# Patient Record
Sex: Male | Born: 1950 | Race: White | Hispanic: No | Marital: Married | Smoking: Never smoker
Health system: Southern US, Community
[De-identification: ages and names within clinical notes are randomized; demographics above are authoritative.]

## PROBLEM LIST (undated history)

## (undated) DIAGNOSIS — K219 Gastro-esophageal reflux disease without esophagitis: Secondary | ICD-10-CM

## (undated) DIAGNOSIS — M1990 Unspecified osteoarthritis, unspecified site: Secondary | ICD-10-CM

## (undated) DIAGNOSIS — Z9289 Personal history of other medical treatment: Secondary | ICD-10-CM

## (undated) DIAGNOSIS — K754 Autoimmune hepatitis: Secondary | ICD-10-CM

## (undated) DIAGNOSIS — K9041 Non-celiac gluten sensitivity: Secondary | ICD-10-CM

## (undated) DIAGNOSIS — J189 Pneumonia, unspecified organism: Secondary | ICD-10-CM

## (undated) DIAGNOSIS — E785 Hyperlipidemia, unspecified: Secondary | ICD-10-CM

## (undated) HISTORY — DX: Autoimmune hepatitis: K75.4

## (undated) HISTORY — PX: CHOLECYSTECTOMY: SHX55

## (undated) HISTORY — DX: Personal history of other medical treatment: Z92.89

## (undated) HISTORY — DX: Hyperlipidemia, unspecified: E78.5

## (undated) HISTORY — PX: SPINE SURGERY: SHX786

## (undated) HISTORY — DX: Non-celiac gluten sensitivity: K90.41

## (undated) HISTORY — PX: BACK SURGERY: SHX140

---

## 1998-11-29 ENCOUNTER — Encounter: Payer: Self-pay | Admitting: Emergency Medicine

## 1998-11-29 ENCOUNTER — Emergency Department (HOSPITAL_COMMUNITY): Admission: EM | Admit: 1998-11-29 | Discharge: 1998-11-29 | Payer: Self-pay | Admitting: Emergency Medicine

## 1999-10-20 ENCOUNTER — Emergency Department (HOSPITAL_COMMUNITY): Admission: EM | Admit: 1999-10-20 | Discharge: 1999-10-20 | Payer: Self-pay | Admitting: Emergency Medicine

## 1999-10-27 ENCOUNTER — Ambulatory Visit (HOSPITAL_COMMUNITY): Admission: RE | Admit: 1999-10-27 | Discharge: 1999-10-27 | Payer: Self-pay | Admitting: Neurosurgery

## 1999-10-27 ENCOUNTER — Encounter: Payer: Self-pay | Admitting: Neurosurgery

## 1999-11-06 ENCOUNTER — Encounter: Payer: Self-pay | Admitting: Neurosurgery

## 1999-11-06 ENCOUNTER — Ambulatory Visit (HOSPITAL_COMMUNITY): Admission: RE | Admit: 1999-11-06 | Discharge: 1999-11-06 | Payer: Self-pay | Admitting: Neurosurgery

## 1999-11-07 ENCOUNTER — Encounter: Payer: Self-pay | Admitting: Neurological Surgery

## 1999-11-07 ENCOUNTER — Inpatient Hospital Stay (HOSPITAL_COMMUNITY): Admission: AD | Admit: 1999-11-07 | Discharge: 1999-11-09 | Payer: Self-pay | Admitting: Neurological Surgery

## 2000-09-12 ENCOUNTER — Encounter: Payer: Self-pay | Admitting: Neurosurgery

## 2000-09-12 ENCOUNTER — Ambulatory Visit (HOSPITAL_COMMUNITY): Admission: RE | Admit: 2000-09-12 | Discharge: 2000-09-12 | Payer: Self-pay | Admitting: Neurosurgery

## 2001-09-16 ENCOUNTER — Encounter (INDEPENDENT_AMBULATORY_CARE_PROVIDER_SITE_OTHER): Payer: Self-pay | Admitting: *Deleted

## 2001-09-16 ENCOUNTER — Ambulatory Visit (HOSPITAL_COMMUNITY): Admission: RE | Admit: 2001-09-16 | Discharge: 2001-09-16 | Payer: Self-pay | Admitting: Family Medicine

## 2001-09-22 ENCOUNTER — Inpatient Hospital Stay (HOSPITAL_COMMUNITY): Admission: AD | Admit: 2001-09-22 | Discharge: 2001-09-30 | Payer: Self-pay | Admitting: Family Medicine

## 2001-09-24 ENCOUNTER — Encounter: Payer: Self-pay | Admitting: Family Medicine

## 2001-10-07 ENCOUNTER — Encounter: Admission: RE | Admit: 2001-10-07 | Discharge: 2001-10-07 | Payer: Self-pay | Admitting: Family Medicine

## 2001-10-10 ENCOUNTER — Encounter: Admission: RE | Admit: 2001-10-10 | Discharge: 2001-10-10 | Payer: Self-pay | Admitting: Family Medicine

## 2001-11-18 ENCOUNTER — Encounter: Admission: RE | Admit: 2001-11-18 | Discharge: 2001-11-18 | Payer: Self-pay | Admitting: Infectious Diseases

## 2001-11-21 ENCOUNTER — Encounter: Admission: RE | Admit: 2001-11-21 | Discharge: 2001-11-21 | Payer: Self-pay | Admitting: Family Medicine

## 2001-12-23 ENCOUNTER — Encounter: Admission: RE | Admit: 2001-12-23 | Discharge: 2001-12-23 | Payer: Self-pay | Admitting: Infectious Diseases

## 2002-02-03 ENCOUNTER — Encounter: Admission: RE | Admit: 2002-02-03 | Discharge: 2002-02-03 | Payer: Self-pay | Admitting: Infectious Diseases

## 2004-07-10 ENCOUNTER — Encounter: Admission: RE | Admit: 2004-07-10 | Discharge: 2004-07-10 | Payer: Self-pay | Admitting: Family Medicine

## 2005-03-16 ENCOUNTER — Encounter: Admission: RE | Admit: 2005-03-16 | Discharge: 2005-03-16 | Payer: Self-pay | Admitting: Family Medicine

## 2005-12-15 ENCOUNTER — Emergency Department (HOSPITAL_COMMUNITY): Admission: EM | Admit: 2005-12-15 | Discharge: 2005-12-15 | Payer: Self-pay | Admitting: Emergency Medicine

## 2005-12-21 ENCOUNTER — Ambulatory Visit (HOSPITAL_COMMUNITY): Admission: RE | Admit: 2005-12-21 | Discharge: 2005-12-21 | Payer: Self-pay | Admitting: Interventional Cardiology

## 2006-01-19 ENCOUNTER — Encounter: Admission: RE | Admit: 2006-01-19 | Discharge: 2006-01-19 | Payer: Self-pay | Admitting: Neurosurgery

## 2006-01-24 ENCOUNTER — Ambulatory Visit (HOSPITAL_COMMUNITY): Admission: RE | Admit: 2006-01-24 | Discharge: 2006-01-24 | Payer: Self-pay | Admitting: Neurosurgery

## 2006-01-26 ENCOUNTER — Encounter: Payer: Self-pay | Admitting: Neurosurgery

## 2006-01-26 ENCOUNTER — Inpatient Hospital Stay (HOSPITAL_COMMUNITY): Admission: AD | Admit: 2006-01-26 | Discharge: 2006-01-29 | Payer: Self-pay | Admitting: Neurosurgery

## 2006-05-03 DIAGNOSIS — K589 Irritable bowel syndrome without diarrhea: Secondary | ICD-10-CM | POA: Insufficient documentation

## 2006-05-03 DIAGNOSIS — E7800 Pure hypercholesterolemia, unspecified: Secondary | ICD-10-CM | POA: Insufficient documentation

## 2006-08-30 ENCOUNTER — Encounter: Admission: RE | Admit: 2006-08-30 | Discharge: 2006-08-30 | Payer: Self-pay | Admitting: Family Medicine

## 2007-02-26 ENCOUNTER — Encounter (INDEPENDENT_AMBULATORY_CARE_PROVIDER_SITE_OTHER): Payer: Self-pay | Admitting: Interventional Radiology

## 2007-02-26 ENCOUNTER — Ambulatory Visit (HOSPITAL_COMMUNITY): Admission: RE | Admit: 2007-02-26 | Discharge: 2007-02-26 | Payer: Self-pay | Admitting: Gastroenterology

## 2007-06-02 ENCOUNTER — Encounter: Admission: RE | Admit: 2007-06-02 | Discharge: 2007-06-02 | Payer: Self-pay | Admitting: Neurosurgery

## 2007-06-19 ENCOUNTER — Ambulatory Visit (HOSPITAL_COMMUNITY): Admission: RE | Admit: 2007-06-19 | Discharge: 2007-06-19 | Payer: Self-pay | Admitting: Neurosurgery

## 2007-06-26 ENCOUNTER — Encounter: Admission: RE | Admit: 2007-06-26 | Discharge: 2007-06-26 | Payer: Self-pay | Admitting: Neurosurgery

## 2007-08-23 ENCOUNTER — Inpatient Hospital Stay (HOSPITAL_COMMUNITY): Admission: RE | Admit: 2007-08-23 | Discharge: 2007-08-26 | Payer: Self-pay | Admitting: Neurosurgery

## 2009-04-22 ENCOUNTER — Ambulatory Visit: Admission: RE | Admit: 2009-04-22 | Discharge: 2009-04-22 | Payer: Self-pay | Admitting: Family Medicine

## 2009-04-22 ENCOUNTER — Encounter: Payer: Self-pay | Admitting: Family Medicine

## 2009-04-22 ENCOUNTER — Ambulatory Visit: Payer: Self-pay | Admitting: Vascular Surgery

## 2009-04-23 ENCOUNTER — Inpatient Hospital Stay (HOSPITAL_COMMUNITY): Admission: EM | Admit: 2009-04-23 | Discharge: 2009-04-24 | Payer: Self-pay | Admitting: Emergency Medicine

## 2009-04-23 ENCOUNTER — Encounter (INDEPENDENT_AMBULATORY_CARE_PROVIDER_SITE_OTHER): Payer: Self-pay | Admitting: General Surgery

## 2009-05-01 ENCOUNTER — Inpatient Hospital Stay (HOSPITAL_COMMUNITY): Admission: EM | Admit: 2009-05-01 | Discharge: 2009-05-07 | Payer: Self-pay | Admitting: Emergency Medicine

## 2010-01-03 ENCOUNTER — Encounter: Admission: RE | Admit: 2010-01-03 | Discharge: 2010-01-03 | Payer: Self-pay | Admitting: Neurosurgery

## 2010-01-13 ENCOUNTER — Encounter: Admission: RE | Admit: 2010-01-13 | Discharge: 2010-01-13 | Payer: Self-pay | Admitting: Neurosurgery

## 2010-01-26 ENCOUNTER — Encounter: Admission: RE | Admit: 2010-01-26 | Discharge: 2010-01-26 | Payer: Self-pay | Admitting: Neurosurgery

## 2010-05-26 LAB — CBC
HCT: 30.5 % — ABNORMAL LOW (ref 39.0–52.0)
HCT: 38.2 % — ABNORMAL LOW (ref 39.0–52.0)
HCT: 38.5 % — ABNORMAL LOW (ref 39.0–52.0)
HCT: 43.7 % (ref 39.0–52.0)
Hemoglobin: 10.9 g/dL — ABNORMAL LOW (ref 13.0–17.0)
Hemoglobin: 13.4 g/dL (ref 13.0–17.0)
Hemoglobin: 13.4 g/dL (ref 13.0–17.0)
Hemoglobin: 15.1 g/dL (ref 13.0–17.0)
MCHC: 34.7 g/dL (ref 30.0–36.0)
MCHC: 34.7 g/dL (ref 30.0–36.0)
MCHC: 35 g/dL (ref 30.0–36.0)
MCHC: 35.6 g/dL (ref 30.0–36.0)
MCV: 101.2 fL — ABNORMAL HIGH (ref 78.0–100.0)
MCV: 101.2 fL — ABNORMAL HIGH (ref 78.0–100.0)
MCV: 101.7 fL — ABNORMAL HIGH (ref 78.0–100.0)
MCV: 102.3 fL — ABNORMAL HIGH (ref 78.0–100.0)
Platelets: 111 10*3/uL — ABNORMAL LOW (ref 150–400)
Platelets: 150 10*3/uL (ref 150–400)
Platelets: 174 10*3/uL (ref 150–400)
Platelets: 99 10*3/uL — ABNORMAL LOW (ref 150–400)
RBC: 3.01 MIL/uL — ABNORMAL LOW (ref 4.22–5.81)
RBC: 3.73 MIL/uL — ABNORMAL LOW (ref 4.22–5.81)
RBC: 3.8 MIL/uL — ABNORMAL LOW (ref 4.22–5.81)
RBC: 4.3 MIL/uL (ref 4.22–5.81)
RDW: 16.6 % — ABNORMAL HIGH (ref 11.5–15.5)
RDW: 16.6 % — ABNORMAL HIGH (ref 11.5–15.5)
RDW: 16.7 % — ABNORMAL HIGH (ref 11.5–15.5)
RDW: 17 % — ABNORMAL HIGH (ref 11.5–15.5)
WBC: 4.1 10*3/uL (ref 4.0–10.5)
WBC: 4.6 10*3/uL (ref 4.0–10.5)
WBC: 7.9 10*3/uL (ref 4.0–10.5)
WBC: 9.4 10*3/uL (ref 4.0–10.5)

## 2010-05-26 LAB — COMPREHENSIVE METABOLIC PANEL
ALT: 25 U/L (ref 0–53)
ALT: 31 U/L (ref 0–53)
ALT: 40 U/L (ref 0–53)
ALT: 52 U/L (ref 0–53)
ALT: 53 U/L (ref 0–53)
AST: 106 U/L — ABNORMAL HIGH (ref 0–37)
AST: 39 U/L — ABNORMAL HIGH (ref 0–37)
AST: 48 U/L — ABNORMAL HIGH (ref 0–37)
AST: 52 U/L — ABNORMAL HIGH (ref 0–37)
AST: 84 U/L — ABNORMAL HIGH (ref 0–37)
Albumin: 2.8 g/dL — ABNORMAL LOW (ref 3.5–5.2)
Albumin: 3 g/dL — ABNORMAL LOW (ref 3.5–5.2)
Albumin: 3.2 g/dL — ABNORMAL LOW (ref 3.5–5.2)
Albumin: 3.4 g/dL — ABNORMAL LOW (ref 3.5–5.2)
Albumin: 3.9 g/dL (ref 3.5–5.2)
Alkaline Phosphatase: 41 U/L (ref 39–117)
Alkaline Phosphatase: 50 U/L (ref 39–117)
Alkaline Phosphatase: 71 U/L (ref 39–117)
Alkaline Phosphatase: 75 U/L (ref 39–117)
Alkaline Phosphatase: 97 U/L (ref 39–117)
BUN: 10 mg/dL (ref 6–23)
BUN: 6 mg/dL (ref 6–23)
BUN: 7 mg/dL (ref 6–23)
BUN: 8 mg/dL (ref 6–23)
BUN: 8 mg/dL (ref 6–23)
CO2: 26 mEq/L (ref 19–32)
CO2: 26 mEq/L (ref 19–32)
CO2: 26 mEq/L (ref 19–32)
CO2: 27 mEq/L (ref 19–32)
CO2: 28 mEq/L (ref 19–32)
Calcium: 7.9 mg/dL — ABNORMAL LOW (ref 8.4–10.5)
Calcium: 8.1 mg/dL — ABNORMAL LOW (ref 8.4–10.5)
Calcium: 8.5 mg/dL (ref 8.4–10.5)
Calcium: 8.6 mg/dL (ref 8.4–10.5)
Calcium: 9.3 mg/dL (ref 8.4–10.5)
Chloride: 104 mEq/L (ref 96–112)
Chloride: 106 mEq/L (ref 96–112)
Chloride: 107 mEq/L (ref 96–112)
Chloride: 108 mEq/L (ref 96–112)
Chloride: 108 mEq/L (ref 96–112)
Creatinine, Ser: 0.83 mg/dL (ref 0.4–1.5)
Creatinine, Ser: 0.84 mg/dL (ref 0.4–1.5)
Creatinine, Ser: 0.86 mg/dL (ref 0.4–1.5)
Creatinine, Ser: 0.87 mg/dL (ref 0.4–1.5)
Creatinine, Ser: 0.92 mg/dL (ref 0.4–1.5)
GFR calc Af Amer: >60 mL/min (ref >60)
GFR calc Af Amer: >60 mL/min (ref >60)
GFR calc Af Amer: >60 mL/min (ref >60)
GFR calc Af Amer: >60 mL/min (ref >60)
GFR calc Af Amer: >60 mL/min (ref >60)
GFR calc non Af Amer: >60 mL/min (ref >60)
GFR calc non Af Amer: >60 mL/min (ref >60)
GFR calc non Af Amer: >60 mL/min (ref >60)
GFR calc non Af Amer: >60 mL/min (ref >60)
GFR calc non Af Amer: >60 mL/min (ref >60)
Glucose, Bld: 103 mg/dL — ABNORMAL HIGH (ref 70–99)
Glucose, Bld: 106 mg/dL — ABNORMAL HIGH (ref 70–99)
Glucose, Bld: 108 mg/dL — ABNORMAL HIGH (ref 70–99)
Glucose, Bld: 117 mg/dL — ABNORMAL HIGH (ref 70–99)
Glucose, Bld: 85 mg/dL (ref 70–99)
Potassium: 3.8 mEq/L (ref 3.5–5.1)
Potassium: 3.9 mEq/L (ref 3.5–5.1)
Potassium: 4.1 mEq/L (ref 3.5–5.1)
Potassium: 4.1 mEq/L (ref 3.5–5.1)
Potassium: 4.4 mEq/L (ref 3.5–5.1)
Sodium: 135 mEq/L (ref 135–145)
Sodium: 137 mEq/L (ref 135–145)
Sodium: 138 mEq/L (ref 135–145)
Sodium: 138 mEq/L (ref 135–145)
Sodium: 138 mEq/L (ref 135–145)
Total Bilirubin: 1.4 mg/dL — ABNORMAL HIGH (ref 0.3–1.2)
Total Bilirubin: 1.4 mg/dL — ABNORMAL HIGH (ref 0.3–1.2)
Total Bilirubin: 2.1 mg/dL — ABNORMAL HIGH (ref 0.3–1.2)
Total Bilirubin: 3.9 mg/dL — ABNORMAL HIGH (ref 0.3–1.2)
Total Bilirubin: 5 mg/dL — ABNORMAL HIGH (ref 0.3–1.2)
Total Protein: 6.6 g/dL (ref 6.0–8.3)
Total Protein: 6.7 g/dL (ref 6.0–8.3)
Total Protein: 7.2 g/dL (ref 6.0–8.3)
Total Protein: 7.5 g/dL (ref 6.0–8.3)
Total Protein: 8.5 g/dL — ABNORMAL HIGH (ref 6.0–8.3)

## 2010-05-26 LAB — HEPATIC FUNCTION PANEL
ALT: 26 U/L (ref 0–53)
AST: 44 U/L — ABNORMAL HIGH (ref 0–37)
Albumin: 3.5 g/dL (ref 3.5–5.2)
Alkaline Phosphatase: 61 U/L (ref 39–117)
Bilirubin, Direct: 0.2 mg/dL (ref 0.0–0.3)
Indirect Bilirubin: 0.9 mg/dL (ref 0.3–0.9)
Total Bilirubin: 1.1 mg/dL (ref 0.3–1.2)
Total Protein: 8.1 g/dL (ref 6.0–8.3)

## 2010-05-26 LAB — DIFFERENTIAL
Basophils Absolute: 0 10*3/uL (ref 0.0–0.1)
Basophils Absolute: 0 10*3/uL (ref 0.0–0.1)
Basophils Relative: 0 % (ref 0–1)
Basophils Relative: 0 % (ref 0–1)
Eosinophils Absolute: 0 10*3/uL (ref 0.0–0.7)
Eosinophils Absolute: 0.1 10*3/uL (ref 0.0–0.7)
Eosinophils Relative: 0 % (ref 0–5)
Eosinophils Relative: 1 % (ref 0–5)
Lymphocytes Relative: 14 % (ref 12–46)
Lymphocytes Relative: 5 % — ABNORMAL LOW (ref 12–46)
Lymphs Abs: 0.4 10*3/uL — ABNORMAL LOW (ref 0.7–4.0)
Lymphs Abs: 0.6 10*3/uL — ABNORMAL LOW (ref 0.7–4.0)
Monocytes Absolute: 0.3 10*3/uL (ref 0.1–1.0)
Monocytes Absolute: 0.3 10*3/uL (ref 0.1–1.0)
Monocytes Relative: 3 % (ref 3–12)
Monocytes Relative: 8 % (ref 3–12)
Neutro Abs: 3.2 10*3/uL (ref 1.7–7.7)
Neutro Abs: 8.6 10*3/uL — ABNORMAL HIGH (ref 1.7–7.7)
Neutrophils Relative %: 77 % (ref 43–77)
Neutrophils Relative %: 92 % — ABNORMAL HIGH (ref 43–77)

## 2010-05-26 LAB — APTT: aPTT: 34 seconds (ref 24–37)

## 2010-05-26 LAB — PROTIME-INR
INR: 1.28 (ref 0.00–1.49)
Prothrombin Time: 15.9 seconds — ABNORMAL HIGH (ref 11.6–15.2)

## 2010-05-26 LAB — LIPASE, BLOOD
Lipase: 111 U/L — ABNORMAL HIGH (ref 11–59)
Lipase: 1201 U/L — ABNORMAL HIGH (ref 11–59)
Lipase: 31 U/L (ref 11–59)
Lipase: >2000 U/L — ABNORMAL HIGH (ref 11–59)

## 2010-05-30 LAB — COMPREHENSIVE METABOLIC PANEL
ALT: 26 U/L (ref 0–53)
ALT: 28 U/L (ref 0–53)
ALT: 29 U/L (ref 0–53)
ALT: 33 U/L (ref 0–53)
AST: 31 U/L (ref 0–37)
AST: 33 U/L (ref 0–37)
AST: 35 U/L (ref 0–37)
AST: 38 U/L — ABNORMAL HIGH (ref 0–37)
Albumin: 2.8 g/dL — ABNORMAL LOW (ref 3.5–5.2)
Albumin: 2.9 g/dL — ABNORMAL LOW (ref 3.5–5.2)
Albumin: 2.9 g/dL — ABNORMAL LOW (ref 3.5–5.2)
Albumin: 3.1 g/dL — ABNORMAL LOW (ref 3.5–5.2)
Alkaline Phosphatase: 60 U/L (ref 39–117)
Alkaline Phosphatase: 64 U/L (ref 39–117)
Alkaline Phosphatase: 70 U/L (ref 39–117)
Alkaline Phosphatase: 71 U/L (ref 39–117)
BUN: 3 mg/dL — ABNORMAL LOW (ref 6–23)
BUN: 3 mg/dL — ABNORMAL LOW (ref 6–23)
BUN: 4 mg/dL — ABNORMAL LOW (ref 6–23)
BUN: 7 mg/dL (ref 6–23)
CO2: 26 mEq/L (ref 19–32)
CO2: 27 mEq/L (ref 19–32)
CO2: 28 mEq/L (ref 19–32)
CO2: 29 mEq/L (ref 19–32)
Calcium: 7.8 mg/dL — ABNORMAL LOW (ref 8.4–10.5)
Calcium: 8.1 mg/dL — ABNORMAL LOW (ref 8.4–10.5)
Calcium: 8.2 mg/dL — ABNORMAL LOW (ref 8.4–10.5)
Calcium: 8.4 mg/dL (ref 8.4–10.5)
Chloride: 103 mEq/L (ref 96–112)
Chloride: 105 mEq/L (ref 96–112)
Chloride: 107 mEq/L (ref 96–112)
Chloride: 107 mEq/L (ref 96–112)
Creatinine, Ser: 0.85 mg/dL (ref 0.4–1.5)
Creatinine, Ser: 0.85 mg/dL (ref 0.4–1.5)
Creatinine, Ser: 0.91 mg/dL (ref 0.4–1.5)
Creatinine, Ser: 0.95 mg/dL (ref 0.4–1.5)
GFR calc Af Amer: >60 mL/min (ref >60)
GFR calc Af Amer: >60 mL/min (ref >60)
GFR calc Af Amer: >60 mL/min (ref >60)
GFR calc Af Amer: >60 mL/min (ref >60)
GFR calc non Af Amer: >60 mL/min (ref >60)
GFR calc non Af Amer: >60 mL/min (ref >60)
GFR calc non Af Amer: >60 mL/min (ref >60)
GFR calc non Af Amer: >60 mL/min (ref >60)
Glucose, Bld: 80 mg/dL (ref 70–99)
Glucose, Bld: 92 mg/dL (ref 70–99)
Glucose, Bld: 98 mg/dL (ref 70–99)
Glucose, Bld: 99 mg/dL (ref 70–99)
Potassium: 3.4 mEq/L — ABNORMAL LOW (ref 3.5–5.1)
Potassium: 3.5 mEq/L (ref 3.5–5.1)
Potassium: 3.7 mEq/L (ref 3.5–5.1)
Potassium: 3.8 mEq/L (ref 3.5–5.1)
Sodium: 133 mEq/L — ABNORMAL LOW (ref 135–145)
Sodium: 137 mEq/L (ref 135–145)
Sodium: 137 mEq/L (ref 135–145)
Sodium: 137 mEq/L (ref 135–145)
Total Bilirubin: 1.4 mg/dL — ABNORMAL HIGH (ref 0.3–1.2)
Total Bilirubin: 1.5 mg/dL — ABNORMAL HIGH (ref 0.3–1.2)
Total Bilirubin: 2.1 mg/dL — ABNORMAL HIGH (ref 0.3–1.2)
Total Bilirubin: 2.6 mg/dL — ABNORMAL HIGH (ref 0.3–1.2)
Total Protein: 6.6 g/dL (ref 6.0–8.3)
Total Protein: 6.7 g/dL (ref 6.0–8.3)
Total Protein: 6.9 g/dL (ref 6.0–8.3)
Total Protein: 7.6 g/dL (ref 6.0–8.3)

## 2010-05-30 LAB — LIPASE, BLOOD
Lipase: 33 U/L (ref 11–59)
Lipase: 34 U/L (ref 11–59)
Lipase: 41 U/L (ref 11–59)

## 2010-05-30 LAB — CBC
HCT: 34.9 % — ABNORMAL LOW (ref 39.0–52.0)
HCT: 35 % — ABNORMAL LOW (ref 39.0–52.0)
HCT: 36.2 % — ABNORMAL LOW (ref 39.0–52.0)
Hemoglobin: 12.3 g/dL — ABNORMAL LOW (ref 13.0–17.0)
Hemoglobin: 12.3 g/dL — ABNORMAL LOW (ref 13.0–17.0)
Hemoglobin: 12.6 g/dL — ABNORMAL LOW (ref 13.0–17.0)
MCHC: 34.9 g/dL (ref 30.0–36.0)
MCHC: 35 g/dL (ref 30.0–36.0)
MCHC: 35.3 g/dL (ref 30.0–36.0)
MCV: 101.8 fL — ABNORMAL HIGH (ref 78.0–100.0)
MCV: 102.1 fL — ABNORMAL HIGH (ref 78.0–100.0)
MCV: 102.6 fL — ABNORMAL HIGH (ref 78.0–100.0)
Platelets: 108 10*3/uL — ABNORMAL LOW (ref 150–400)
Platelets: 127 10*3/uL — ABNORMAL LOW (ref 150–400)
Platelets: 172 10*3/uL (ref 150–400)
RBC: 3.42 MIL/uL — ABNORMAL LOW (ref 4.22–5.81)
RBC: 3.43 MIL/uL — ABNORMAL LOW (ref 4.22–5.81)
RBC: 3.54 MIL/uL — ABNORMAL LOW (ref 4.22–5.81)
RDW: 16 % — ABNORMAL HIGH (ref 11.5–15.5)
RDW: 16.3 % — ABNORMAL HIGH (ref 11.5–15.5)
RDW: 16.6 % — ABNORMAL HIGH (ref 11.5–15.5)
WBC: 4 10*3/uL (ref 4.0–10.5)
WBC: 4.2 10*3/uL (ref 4.0–10.5)
WBC: 5.7 10*3/uL (ref 4.0–10.5)

## 2010-05-30 LAB — DIFFERENTIAL
Basophils Absolute: 0 10*3/uL (ref 0.0–0.1)
Basophils Absolute: 0 10*3/uL (ref 0.0–0.1)
Basophils Relative: 0 % (ref 0–1)
Basophils Relative: 0 % (ref 0–1)
Eosinophils Absolute: 0.1 10*3/uL (ref 0.0–0.7)
Eosinophils Absolute: 0.1 10*3/uL (ref 0.0–0.7)
Eosinophils Relative: 2 % (ref 0–5)
Eosinophils Relative: 3 % (ref 0–5)
Lymphocytes Relative: 15 % (ref 12–46)
Lymphocytes Relative: 23 % (ref 12–46)
Lymphs Abs: 0.6 10*3/uL — ABNORMAL LOW (ref 0.7–4.0)
Lymphs Abs: 0.9 10*3/uL (ref 0.7–4.0)
Monocytes Absolute: 0.4 10*3/uL (ref 0.1–1.0)
Monocytes Absolute: 0.4 10*3/uL (ref 0.1–1.0)
Monocytes Relative: 10 % (ref 3–12)
Monocytes Relative: 10 % (ref 3–12)
Neutro Abs: 2.5 10*3/uL (ref 1.7–7.7)
Neutro Abs: 3.1 10*3/uL (ref 1.7–7.7)
Neutrophils Relative %: 63 % (ref 43–77)
Neutrophils Relative %: 72 % (ref 43–77)

## 2010-05-30 LAB — CLOSTRIDIUM DIFFICILE EIA: C difficile Toxins A+B, EIA: NEGATIVE

## 2010-05-30 LAB — LIPID PANEL
Cholesterol: 95 mg/dL (ref 0–200)
HDL: 25 mg/dL — ABNORMAL LOW (ref >39)
LDL Cholesterol: 61 mg/dL (ref 0–99)
Total CHOL/HDL Ratio: 3.8 RATIO
Triglycerides: 43 mg/dL (ref <150)
VLDL: 9 mg/dL (ref 0–40)

## 2010-05-30 LAB — EYE CULTURE: Culture: NO GROWTH

## 2010-05-30 LAB — LACTATE DEHYDROGENASE: LDH: 106 U/L (ref 94–250)

## 2010-07-04 ENCOUNTER — Other Ambulatory Visit: Payer: Self-pay | Admitting: Gastroenterology

## 2010-07-19 NOTE — Op Note (Signed)
NAME:  Corey Shannon, Corey Shannon NO.:  [id number]   MEDICAL RECORD NO.:  [id number]          PATIENT TYPE:  INP   LOCATION:  3013                         FACILITY:  [facility]   PHYSICIAN:  Leeroy Cha, M.D.   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   PROCEDURE:  Widening of the laminectomy at T12, L1, L2, and L3 with  posterolateral arthrodesis with bone morphogenetic protein and autograft  and master graft from T10 down to L3, Cell Saver.   SURGEON:  Leeroy Cha, MD   CLINICAL HISTORY:  Corey Shannon did well in the past, underwent twice  lumbar laminectomy.  The patient did well last time.  He developed some  difficulty with his bladder, bowel, and some problem with sacral  function.  We did a laminectomy in the upper lumbar area, thoracic area,  and he did well.  Lately, he developed liver immune disease, and he  started developing the same problem.  We did an MRI, it showed some  kyphosis, which with flexion the patient did not move.  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  The patient is moderately better.  We  took over exploration with a possibility of fusing him from T10 down to  L3.  The surgery was fully explained to him including the possibility of  no improvement, whatsoever.   PROCEDURE IN DETAIL:  Corey Shannon was taken to the OR and he was  positioned in a prone manner.  The skin was cleaned with DuraPrep.  We  knew the lower part of the thoracic area was the spinous process of the  T11, so from thereon, we started 1-3 inches above from T10 all the way  down to the area of L3-L4 and L4-L5, fascia was retracted laterally.  The patient had quite a bit of overgrowth of the facet and although the  canal was opened, the disk was quite narrow.  With the drill, we started  drilling the lateral aspect of the lamina of thoracic L1, L2, L3, and on  the upper part of L4.  Then, we went all the way up until we were able  to visualize the spinous process of T10 and T11 as well as the lateral  facets.  In the past, we used a BMP and autograft in the center and we  found out that after we had good decompression, the area was absolutely  solid.  The canal was wide open and we were able to preserve the facet.  The foramen was also opened with a foraminotomy.  Because of that, I  have my doubts in putting the fusion and pedicle screw will improve  overall making it better.  Nevertheless, yet to be sure, 100%  sure, I  brought the stern to the room to take a look.  In deed, he fully agreed  with me that the pedicle screw would not improve the area because of the  solid.  Because of that, we went up again to the area of T10 and T11.  We drilled the lamina at the lateral aspect of the facet and we went  laterally into the upper part of L3 and a mix of BMP and autograft plus  master graft was used.  Nevertheless, prior to that, we read that there  was no contraindication to use the BMP in this case.  Having good  decompression and re-doing of the arthrodesis, the dura mater was closed  with Gelfoam, and the wound was closed with Vicryl and Steri-Strips.           ______________________________  Leeroy Cha, M.D.     EB/MEDQ  D:  08/23/2007  T:  08/24/2007  Job:  [id number]   cc:   Mayme Genta, M.D.

## 2010-07-19 NOTE — H&P (Signed)
NAME:  Corey Shannon, Corey Shannon NO.:  [id number]   MEDICAL RECORD NO.:  [id number]           PATIENT TYPE:   LOCATION:                                 FACILITY:   PHYSICIAN:  Leeroy Cha, M.D.   DATE OF BIRTH:  26-Oct-1950   DATE OF ADMISSION:  DATE OF DISCHARGE:                              HISTORY & PHYSICAL   Corey Shannon is a gentleman who underwent in the past decompressive  laminectomy because of cauda equina syndrome.  At that time, the patient  complained of difficulty with erection and poor control of bladder and  bowel.  He had surgery in 2007.  He did really well.  Nevertheless,  lately he had been complaining of the same problem with difficulty with  his bladder and bowel, numbness in the genitalia, and back pain.  He  denies any shooting pain down to the lower extremities.  We did an MRI  of the lumbar spine which showed that he has multiple level disk  degeneration with laminectomy from T12 through L4 with kyphosis at the  level of T11-T12.  The patient is being admitted for surgery.  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  His  liver enzymes went really out of control.  He was seen by Dr. Thana Farr and  we got information a few days ago that he was ready for surgery.  He was  seen also by anesthesia who agreed with the surgery.   PAST MEDICAL HISTORY:  Lumbar decompression in the past from T12-L1,  also L1-L2, L2-L3, L3-L4, and L4-L5.  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   MEDICATIONS:  He is at the present time on prednisone and Lipitor.   FAMILY HISTORY:  Unremarkable.   REVIEW OF SYSTEMS:  Positive for back pain and difficulty with bladder  and bowel.   PHYSICAL EXAMINATION:  HEAD, EARS, NOSE, AND THROAT:  Normal.  NECK:  Normal.  LUNGS:  Clear.  HEART:  Heart sounds normal.  ABDOMEN:  Normal.  EXTREMITIES: There were normal strength and normal pulse.  NEUROLOGIC:  Straight leg raising is positive  bilaterally to 60 degrees.  RECTAL:  He has a decreased rectal tone.  GENITALIA:  He has some numbness which involves mostly the genitalia.   The lumbar-thoracic MRI showed that he has multiple-level disk  degeneration all the way down to the cervical area.  The lumbar MRI  showed that he has laminectomy of T12 T1 through L4.  Patency of the  canal and clubbing of the nerve mostly at the levels of L1, L2, and L3  with kyphosis at the level of T12-L1.   RECOMMENDATIONS:  The patient is being admitted for surgery.  As I  mentioned to him on several occasions, the only procedure will be to  decompress again from T10 to L1 and L2 using thoracic and lumbar pedicle  screws with posterolateral arthrodesis using BMP.   He knows that that is the only thing we can do to help him with his  conus medullaris syndrome, and it will not  affect all degenerative disk  diseases he has above and below.  The patient knows all of the risks  such as paralysis, worsening of his bladder and bowel, no improvement  whatsoever in his sexual activity, and further surgery for degenerative  disk disease in multiple layers.           ______________________________  Leeroy Cha, M.D.     EB/MEDQ  D:  08/23/2007  T:  08/23/2007  Job:  [id number]

## 2010-07-22 NOTE — H&P (Signed)
[place]. [facility]  Patient:    Corey Shannon, Corey Shannon                       MRN: [id number] Adm. Date:  [id number] Attending:  Clearnce Sorrel                         History and Physical  NO DICTATION. DD:  11/08/99 TD:  11/08/99 Job: [id number]

## 2010-07-22 NOTE — Discharge Summary (Signed)
. [facility]  Patient:    Corey Shannon, Corey Shannon                       MRN: [id number] Adm. Date:  [id number] Disc. Date: [id number] Attending:  Clearnce Sorrel CC:         Domingo Pulse, M.D., urology   Discharge Summary  ADMISSION DIAGNOSIS:  Lumbar stenosis with conus medullaris syndrome.  DISCHARGE DIAGNOSIS:  Lumbar stenosis with conus medullaris syndrome.  CLINICAL HISTORY:  The patient was admitted as an emergency because of difficulty urinating with cramps in both legs.  In the past, this gentleman had an L1 and 2 diskectomy.  MRI showed stenosis at the level of T12-L1. Surgery was advanced emergently.  LABORATORY:  Normal.  HOSPITAL COURSE:  The patient was taken to surgery on Labor Day, November 07, 1999, and bilateral T12 laminectomy with decompression of spinal cord was done. Today he is better. His bladder is working and he is ambulating.  He has minimal weakness.  He is being discharged to be followed by ______________ office.  CONDITION ON DISCHARGE:  Improved.  DISCHARGE MEDICATIONS:  Percocet and Diazepam.  DIET:  Regular.  ACTIVITIES:  He is not to do any lifting.  FOLLOW-UP:  He will be seen in the office in 10 days. DD:  11/09/99 TD:  11/09/99 Job: [id number]

## 2010-07-22 NOTE — Discharge Summary (Signed)
NAME:  Corey Shannon, Corey Shannon                ACCOUNT NO.:  [id number]   MEDICAL RECORD NO.:  [id number]          PATIENT TYPE:  OIB   LOCATION:  3172                         FACILITY:  [facility]   PHYSICIAN:  Leeroy Cha, M.D.   DATE OF BIRTH:  [birth year]   DATE OF ADMISSION:  08/26/2005  DATE OF DISCHARGE:  08/29/2005                                 DISCHARGE SUMMARY   ADMISSION DIAGNOSIS:  Lumbar stenosis L2-L3, L3-L4 with cauda equina  syndrome.   POSTOPERATIVE DIAGNOSIS:  Lumbar stenosis L2-L3, L3-L4 with cauda equina  syndrome.   CLINICAL HISTORY:  The patient was admitted because of difficulty urinating  and having a bowel movement.  He also had some back pain.  X-rays show  stenosis at the level of L2-L3, L3-L4.  Previously he has had laminectomy of  L4.  Surgery was advised as urgent the day after Thanksgiving.   LABORATORY:  Normal.   COURSE IN THE HOSPITAL:  The patient was taken to surgery and had a  decompressive laminectomy of L2-L3, L3-L4 with a posterolateral fusion using  autograft and BMP from T12 down to L4 was made.  After surgery the patient  is able to urinate on his own, but still has not had a bowel movement.  He  felt that he was ready to go home.   CONDITION ON DISCHARGE:  Improvement in relation to his bladder.   DIET:  Regular.   ACTIVITY:  Not to drive for at least a week.   MEDICATIONS:  He will be taking Urecholine, Flomax, Percocet, and lorazepam.   FOLLOWUP:  He will be seeing [place] in 3 or 4 weeks.  We are going to set up an  appointment with Dr. Amalia Hailey, the urologist, for him to help [place] with his  bladder.           ______________________________  Leeroy Cha, M.D.     EB/MEDQ  D:  01/29/2006  T:  01/29/2006  Job:  [id number]

## 2010-07-22 NOTE — Op Note (Signed)
[place]. [facility]  Patient:    Corey Shannon, Corey Shannon                       MRN: [id number] Proc. Date: 11/07/99 Adm. Date:  [id number] Attending:  Clearnce Sorrel                           Operative Report  PREOPERATIVE DIAGNOSIS:  Thoracolumbar stenosis, T12-L1 with conus medullaris syndrome.  POSTOPERATIVE DIAGNOSIS:  Thoracolumbar stenosis, T12-L1 with conus medullaris syndrome.  OPERATION PERFORMED:  Bilateral T12 laminectomy, decompression of the spinal cord.  Midas Rex.  SURGEON:  Zigmund Daniel. Joya Salm, M.D.  ASSISTANT:  Earleen Newport, M.D.  ANESTHESIA:  INDICATIONS FOR PROCEDURE:  The patient is a 60 year old gentleman who about 12 years ago underwent L1-L2 diskectomy and laminectomy because of sudden onset of weakness with difficulty urinating, losing bladder control.  About four weeks ago the patient fell at home, hit his head and he was having a little bit of bleeding and he was seen in the emergency room where a laceration was taken care of.  He called my office and when I saw him in my office I did a CT scan of the head which was unremarkable.  Clinically, he has complained of tingling sensation in both hands and some soreness in both legs. The physical examination was essentially normal.  He called the office Friday afternoon complaining that he was having a little difficulty urinating. Because of the previous history, we went ahead and did an emergency MRI which showed stenosis at the level of T12-L1 with a central disk and the possibility of some changes in the cord itself.  ____________ Because of that he is being brought today for emergency surgery.  The patient knew of the risks such as no improvement whatsoever, difficulty urinating and with the bowels and also the high possibility that in the near future this gentleman would require major surgery which involves fusion.  The procedure was explained to him and  his wife.  DESCRIPTION OF PROCEDURE:  The patient was taken to the operating room and he was positioned in a prone manner.  We knew by previous x-ray from previous surgery that he had present the spinal stenosis of T11 and L3.  After we scrubbed the area, a midline incision was made from lower part of T11 to the upper part of L2.  Muscles were retracted laterally.  We identified the T11 spinous processes.  Nevertheless we went ahead and we did several x-rays.  We found the lamina of T12 and we did bilateral laminectomy using the Midas Rex and using the 2 and 3 mm Kerrison punch.  A thick yellow ligament was also excised.  There was epidural fat which was removed.  The spinal cord was wide open and we explored the area with small probe and there was no evidence of any fragment.  Nevertheless because of looking at the x-ray there was a diffuse bulging disk, we decided not to go into the area of the disk.  Having good decompression, we repeated the x-ray which showed indeed we were in the right area.  We went lower until we found the scar from the previous surgery. Then the dura mater was covered with Gelfoam and the wound was closed with Vicryl and nylon. DD:  11/07/99 TD:  11/08/99 Job: [id number]

## 2010-07-22 NOTE — H&P (Signed)
NAME:  Corey Shannon, Corey Shannon NO.:  [id number]   MEDICAL RECORD NO.:  [id number]          PATIENT TYPE:  INP   LOCATION:  3008                         FACILITY:  [facility]   PHYSICIAN:  Leeroy Cha, M.D.   DATE OF BIRTH:  19-Nov-1950   DATE OF ADMISSION:  01/26/2006  DATE OF DISCHARGE:  01/29/2006                                HISTORY & PHYSICAL   Mr. Jepsen is a gentleman who in 2001 underwent decompressive laminectomy in  the thoracolumbar area because of weakness of bladder and bowel.  Presently  well.  We did thoracic 12 and L1 decompression.  The patient saw me in my  office on October 18th complaining of burning sensation to the right thigh  and he was having some difficulty with bladder and bowel.  Nevertheless, he  was able to control his bladder and bowel.  He went away and I saw him in my  office on November 21st after we did a myelogram.  Indeed he was having more  difficulty urinating and he has markedly decreased rectal tone.  Also  burning sensation while he was standing.  Because of that we agreed to  proceed with a thoracolumbar myelogram just to be sure there was no evidence  of any tumor, although we knew by myelogram that he had some stenosis at the  L1-L2 to L3 and L3-L4.  The MRI was unremarkable and we brought him the day  after Thanksgiving for surgery.   PAST MEDICAL HISTORY:  1. Lumbar decompression at the level of T12-L1.  2. Diskectomy L1-L2.  3. He has a __________ syndrome history and he has a maxilla problem.  4. He also has spasticity of the colon.   MEDICATIONS:  He is taking Lipitor and aspirin.   FAMILY HISTORY:  Unremarkable.   REVIEW OF SYSTEMS:  Back pain, tingling sensation in both legs, difficulty  with bladder and bowel.   PHYSICAL EXAMINATION:  HEENT:  Normal.  NECK:  Normal.  LUNGS:  Clear.  HEART:  Sounds normal.  ABDOMEN:  Normal.  EXTREMITIES:  Normal pulse.  NEURO:  Mental status normal.  Cranial nerves normal.   Strength is normal.  He has had marked decrease of the rectal tone.  Reflexes are symmetrical.  Coordination and gait normal.   CLINICAL IMPRESSION:  Lumbar stenosis L1-L2, L2-L3, L3-L4.  Cauda equina  syndrome.   RECOMMENDATIONS:  The patient is being admitted for surgery.  Prior to  surgery we have agreed that it is difficult to predict what the outcome will  be but unfortunately I have nothing else to offer.  The other possibility  was to do a complete workup including GI and GU investigation but at the  end, we agreed to decompress the lumbar area  to see how well he does and then do the workup after.  The patient wanted to  proceed because he feels that he is getting worse over the past 3 weeks.  The surgery was fully explained to him including the possibility of no  improvement whatsoever, paralysis, infection, CSF leak.  ______________________________  Leeroy Cha, M.D.     EB/MEDQ  D:  01/30/2006  T:  01/30/2006  Job:  [id number]

## 2010-07-22 NOTE — H&P (Signed)
. [facility]  Patient:    Corey Shannon, Corey Shannon                       MRN: [id number] Adm. Date:  [id number] Attending:  Clearnce Sorrel                         History and Physical  CHIEF COMPLAINT: Herniated nucleus pulposus, T12-L1, with conus medullaris syndrome.  HISTORY OF PRESENT ILLNESS: The patient is a 60 year old white male, who has had previous disk disease at L1-L2 15 years ago and underwent laminectomy at that time.  The patient had been doing fairly well and three weeks ago he suffered a fall in the bathtub and had a small frontal contusion.  He was recovering, though he noted some numbness in his proximal leg and this past weekend he developed increasing difficulty with urination.  He was found to have on MRI a herniated nucleus pulposus at T12-L1 with significant cord compression.  The patient was contacted regarding results of the study and as he was continuing to have significant difficulty he was advised to undergo surgical decompression via laminectomy with decompression.  He is now admitted for this procedure.  PAST MEDICAL HISTORY: His general health has been good.  PAST SURGICAL HISTORY:  1. Lumbar laminectomy 15 years ago.  2. Transurethral resection of the prostate three years ago by Dr. Alona Bene.  CURRENT MEDICATIONS:  1. Lipitor 20 mg q.d.  2. Flomax for intermittent episodes of prostatitis.  3. He also had been given a prescription for cephalexin, having had a     root canal recently completed.  ALLERGIES: No known drug allergies.  FAMILY HISTORY: His father has a history of coronary artery disease and there is also a history of coronary artery disease in the grandfather.  SOCIAL HISTORY: He is married.  His work involves significant amounts of travel and he is planning a trip out of the country to [place] the end of this coming week.  REVIEW OF SYSTEMS: Notable for difficulty with urination.  No  respiratory, GI, cardiovascular, ENT, integumentary difficulties noted.  PHYSICAL EXAMINATION:  VITAL SIGNS: Blood pressure 130/86, heart rate 66 and regular, respirations 14.  NEUROLOGIC: He is alert and oriented, and in no overt distress.  He stands and walks without difficulty.  There is weakness noted proximally in the iliopsoas, being graded 4/5.  No atrophy is noted.  Deep tendon reflexes are 1+ in the patella, trace in the Achilles.  Babinskis downgoing.  Upper extremity strength and reflexes normal.  Cranial nerve examination reveals the pupils are 4 mm and briskly reactive to light and accommodation.  Extraocular movements were full.  Face with symmetric grimace.  Tongue and uvula midline. Sclerae and conjunctivae clear.  HEART: Regular rate and rhythm.  No murmurs heard.  No bruits heard.  LUNGS: Clear to auscultation.  ABDOMEN: Soft.  Bowel sounds positive.  No masses palpable.  EXTREMITIES: No clubbing, cyanosis, or edema.  IMPRESSION/PLAN: The patient has a herniated disk at the T12-L1 level and is now being admitted to undergo surgical decompression for developing conus medullaris syndrome.DD:  11/07/99 TD:  11/07/99 Job: [id number]

## 2010-07-22 NOTE — Discharge Summary (Signed)
NAME:  Corey Shannon, Corey Shannon                          ACCOUNT NO.:  [id number]   MEDICAL RECORD NO.:  [id number]                   PATIENT TYPE:  INP   LOCATION:  5730                                 FACILITY:  [facility]   PHYSICIAN:  Jamal Collin. Hensel, M.D.             DATE OF BIRTH:  1950-12-04   DATE OF ADMISSION:  09/22/2001  DATE OF DISCHARGE:  09/30/2001                                 DISCHARGE SUMMARY   DISCHARGE DIAGNOSES:  1. Left toe cellulitis.  2. Nocardia lymphocutaneous infection.  3. Irritable bowel syndrome.  4. Hypercholesterolemia.   DISCHARGE MEDICATIONS:  1. Bactrim Double Strength two tablets p.o. b.i.d.  2. Vicodin 5/325 1-2 tablets p.o. q.4-6h. p.r.n. for pain.  3. Eucerin cream to be applied topically to the infected toe twice per day.   DISCHARGE ACTIVITIES:  No activity restrictions.   DISCHARGE DIET:  No diet restrictions.   WOUND CARE:  Instructions were to keep the skin area  moist and watch for  cracking and blistering, apply Neosporin to open areas and Eucerin cream  twice daily.   FOLLOWUP APPOINTMENTS:  The patient will follow up with Dr. Alford Highland on  September 8 at 11:45 and will follow up with Dr. Kassie Mends on August 7 at  1:30 p.m. at the [facility].   CHIEF COMPLAINT:  The patient was admitted on September 22, 2001, with the  complaint of left toe swelling and cellulitis.   HISTORY OF PRESENT ILLNESS:  This is a 60 year old white male who presented  to urgent care on the day of admission for increasing erythema and swelling  of his left third toe.  The patient was working at his house approximately  two or three weeks ago and dropped a brick on his toe on the left side.  The  patient said that the toe was sore but there was no drainage or erythema  until approximately four days ago.  No major open wounds or lacerations were  noted on the toe after initial injury.  The patient saw his primary care  physician at his workplace  for the increased swelling and erythema on  Friday, July 18.  At that time the patient denied any fevers, chills,  nausea, vomiting or other systemic symptoms.  An incision and drainage was  performed on the toe and the patient was started on p.o. Keflex.  Over the  next following two days the patient started noticing streaking up the dorsum  of the foot and up the medial calf and then to the left groin.  On the night  prior to admission the patient began to run a fever of 100.5 somewhat  relieved with Tylenol.  The patient also began to notice significant  inguinal adenopathy and tenderness which occurred the night before  admission.  The patient was seen in urgent care and admitted to [facility] for IV antibiotics for  suspected left toe cellulitis refractory to  p.o. antibiotics.   PAST MEDICAL HISTORY:  1. The patient has a past medical history significant for chronic     prostatitis for which he takes Flomax and diazepam p.r.n. for     exacerbations.  2. Irritable bowel syndrome for which he sees Dr. Currie Paris and was on a seven-     day course of mesalamine enema treatments upon admission.  The patient     had a colonoscopy with biopsies performed in June 2003.   HOSPITAL COURSE BY DIAGNOSIS:  1. Cellulitis.  Blood cultures were obtained initially on presentation.  The     patient's pain was managed with Percocet and Toradol as needed.  The     patient had an initial white blood cell count of 12.5 upon admission and     was found to be afebrile at that time with a temperature of 98.8.  The     patient had a second incision and drainage performed on the left third     toe with culture and Gram stains obtained at the time of procedure.  We     were concerned about strep and staph cellulitis but were considering     possible Pseudomonas coverage considering the patient was wearing lots of     tennis shoes and this is a good setup for Pseudomonas infection, although     the  patient was immunocompetent.  There was some question about whether     or not the patient truly failed p.o. antibiotics or whether inadequate     amounts of medications were the cause for the failure.  The patient was     started on Ancef initially IV antibiotics.  Over the next 2-3 days the     patient's pain was fairly well controlled although the patient complained     of more inguinal lymphadenopathy and discomfort in the inguinal area.     The patient continued to spike daily fevers in the realm of 101.4 and was     unable to go more than 24 hours without fever.  Secondary to increased     fevers we switched the patient from Ancef to Unasyn for possible     pseudomonal coverage and decided to get an MRI of the foot to look for     suspected osteomyelitis.  The MRI showed no signs of abscess or     osteomyelitis.  The patient was switched from Unasyn to Zosyn after     continuing to spike fevers on Unasyn as well.  Over the course of the by     hospital day #4 or #5 the patient had continued to have increasing     erythema within the demarcated boundaries initially drawn on     presentation.  The patient started developing lymphocutaneous nodules     along what appeared to be the saphenous vein line up the medial portions     of the calf and thigh.  This caused the patient a great deal of     discomfort at which time the patient was switched over from Percocet to     Toradol and morphine p.r.n. for pain.  Infectious disease was consulted     on hospital day #4 for failure to control fevers and questionable     resistance to the current antibiotic regimen.  The patient had a second     incision and drainage of focal pus pockets of the third toe  on hospital     day #5 and continued to spike fevers, even on Zosyn therapy.  The patient     continued to spike fevers with systemic symptoms of fevers, chills,    shakes at times febrile episodes.  On hospital day #6 wound cultures grew     out  Nocardia at which time the patient was discontinued off his Zosyn and     switched to Primaxin 500 mg IV q.8h. and Bactrim Double Strength two p.o.     q.8h.  Infectious disease was reconsulted, Dr. Alford Highland was reconsulted     for treatment of lymphocutaneous Nocardia infection.  The patient was     started on the two double antibiotics and remained afebrile for 48 hours     at which point in time Dr. Alford Highland felt it was safe to discontinue the     Primaxin and sent the patient home on Bactrim Double Strength two p.o.     q.12h. with regular followups secondary to needing a chronic course of     Bactrim therapy.  The patient's pain was well controlled with Vicodin     before the patient was discharged, and the patient was discharged on     Vicodin and Bactrim with followup for Nocardia lymphangitis to follow up     with Dr. Alford Highland and Dr. Kassie Mends.  2. Nocardia lymphocutaneous infection.  See course of events for #1, left     toe cellulitis.  It was believed that there was a concomitant cellulitis     and lymphocutaneous picture.  Once the cellulitis was adequately treated     with IV antibiotics the cellulitis was refractory to current antibiotic     regimen.  It became more clear once cultures were obtained.  Blood     cultures were held in the laboratory and will be rechecked in several     weeks for signs of Nocardia growth, as Nocardia is a slow-growing     organism that may take several weeks to grow out on plates.  We will need     to follow these up after the patient is discharged.  3. Irritable bowel syndrome.  Discussed with Dr. Currie Paris the continuation of     the mesalamine enemas.  The patient originally completed a seven-day     course upon admission and Dr. Currie Paris felt that, given recent biopsies,     the patient should continue on mesalamine enemas while he was an     inpatient for another seven days.  The patient received daily mesalamine     enemas and complained of  no GI complaints including diarrhea,     constipation, or     nausea or GI pain.  The patient was discharged to follow up with Dr.     Currie Paris as previously arranged.  4. Hypercholesterolemia.  The patient was started on his outpatient     medication of 20 mg of Lipitor q.d. and monitored.  No changes in regimen     during hospital course admission.     Leonides Schanz, M.D.                      William A. Andria Frames, M.D.    RM/MEDQ  D:  10/09/2001  T:  10/14/2001  Job:  [id number]

## 2010-07-22 NOTE — Discharge Summary (Signed)
NAME:  Corey Shannon, Corey Shannon                ACCOUNT NO.:  [id number]   MEDICAL RECORD NO.:  [id number]          PATIENT TYPE:  INP   LOCATION:  3013                         FACILITY:  [facility]   PHYSICIAN:  Leeroy Cha, M.D.   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   POSTOPERATIVE DIAGNOSIS:  Degenerative disc disease of thoracolumbar  with cauda equina syndrome.   PROCEDURE:  The patient was admitted to the hospital because of pain in  both the leg, numbness in the genitalia, and difficult with his sexual  function, bladder and bowel.  Previously this gentleman had  decompression of the lumbar area.  X-rays show that he has kyphosis with  stenosis in the thoracolumbar area.  Surgery was advised.  Laboratory  normal.   COURSE IN THE HOSPITAL:  The patient was taken to surgery and  decompression from thoracic 11 to lumbar 3 was done with posterolateral  arthrodesis from T10 to L3 using autograft and BMP.  After surgery, the  patient complained with a normal postoperative pain but he felt better  to the point that he noticed 48-hour later improved his bladder and  bowel function.  The patient has been ambulating.  He has been  discharged, being followed by me.   CONDITION ON DISCHARGE:  Improvement.   MEDICATIONS:  Percocet, Diazepam.   DIET:  Regular.   ACTIVITIES:  Not to drive for at least 2 weeks.   FOLLOWUP:  He will be seen in my office in 4 weeks.           ______________________________  Leeroy Cha, M.D.     EB/MEDQ  D:  09/17/2007  T:  09/18/2007  Job:  [id number]

## 2010-07-22 NOTE — Op Note (Signed)
NAME:  Corey Shannon, Corey Shannon                ACCOUNT NO.:  [id number]   MEDICAL RECORD NO.:  [id number]          PATIENT TYPE:  INP   LOCATION:  3008                         FACILITY:  [facility]   PHYSICIAN:  Leeroy Cha, M.D.   DATE OF BIRTH:  04/28/1950   DATE OF PROCEDURE:  01/26/2006  DATE OF DISCHARGE:                                 OPERATIVE REPORT   PREOPERATIVE DIAGNOSES:  1. Lumbar stenosis, L2-3, L3-4.  2. Cauda equina syndrome.  3. Status post decompressive laminectomy, T11-L1 seven years ago.   POSTOPERATIVE DIAGNOSES:  1. Lumbar stenosis, L2-3, L3-4.  2. Cauda equina syndrome.  3. Status post decompressive laminectomy, T11-L1 seven years ago.   PROCEDURES:  1. Bilateral 2, 3, 4 laminectomy.  2. Posterolateral arthrodesis from T12 to L4 with bone morphogenic protein      and autograft.   SURGEON:  Leeroy Cha, M.D.   ASSISTANT:  Ophelia Charter, M.D.   CLINICAL HISTORY:  Mr. Corey Shannon is a 60 year old gentleman, who back 7 years  ago underwent decompressive laminectomy at the thoracic level/12 and L1  secondary to weakness of the legs associated with urinary incontinence, as  well as bowel.  The patient did well.  I saw him on 1 occasion with some  back discomfort, but he did really well.  About 6 or 8 weeks ago, he  underwent some type of face surgery, and after that, he noticed some  weakness in his bladder and bowel, as well as tingling sensation in both  legs.  I saw him in my office about 5 weeks ago and he decided to wait to  see if he would get better on his own.  Nevertheless, we agreed on a lumbar  myelogram, thoracolumbar, which shows only stenosis at the levels of 2-3 and  3-4.  I saw him 2 days ago later in my office, and, indeed, this gentleman  had numbness in the scrotum, as well as marked hypotonic rectal tone.  He  had no weakness in the lower extremities.  Because of that, we went ahead  and we did an emergency MRI, thoracolumbar, to rule out the  possibility of a  lower lesion, such as a tumor, or sclerosis.  Nothing was found.  I talked  to him day, Thanksgiving, and I mentioned to him the findings of the MRI.  I  gave him 2 choices.  One will be to do some urodynamic studies of the  bladder to see if his problem is coming from peripheral neuropathy or  something else, or to proceed with surgery.  I told him that the surgery  will be to decompress the L2, L3, L4 area, as well as fusion to avoid  kyphosis.  I told him that that is the only thing I can do, and then I  mentioned to him that I was not 100% sure that the surgery would be helpful  as 7 years ago.   Nevertheless, these were the only options, and then, if after ruling out a  surgical lesion he does not improve, we can continue with the workup, as  we  mentioned above.  The patient fully agreed and surgery was scheduled for  today, the day after Thanksgiving.  The risks included no improvement,  worsening of his clinical condition, infection, CSF leak.   DESCRIPTION OF PROCEDURES:  Mr. Corey Shannon was taken to the operating room and he  was positioned in a prone manner.  The back was prepared with Betadine.  A  midline incision following the previous one was made.  X-rays showed that,  we were at the level of L2 to L4.  Then, we went laterally and retracted all  the way down to the joint between the facet and the transverse process.  Because he already has quite a bit of degenerative disk disease and some  kyphosis from previous surgery, we went ahead and we visualized the facets  of T12 and the transverse processes all the way down to L4.  With the  [place], we removed the spinous processes of L2, L3 and L4, and with the  drill, we drilled the laminae all the way down into the joint and removing  1/3 of the medial facet.  He had some narrowing of the foramina and  foraminotomy was accomplished.  At the end, we had a good decompression from  L2 down to L4.  The foramina were  open.  Then, with the drill, we removed  the periosteum of the lateral aspect of the facets of T12, as well as the  transverse processes all the way down to L4.  Then, a mix of BMP as well as  autograft was used at the site of the fusion.  The area was irrigated.  Hemostasis was done with bipolar.  The wound was closed with Vicryl and with  Steri-Strips.           ______________________________  Leeroy Cha, M.D.     EB/MEDQ  D:  01/26/2006  T:  01/26/2006  Job:  [id number]

## 2010-07-22 NOTE — H&P (Signed)
[place]  Patient:    Corey Shannon, BROKER Visit Number: [id number] MRN: [id number]          Service Type: MED Location: [phone] Attending Physician:  Zigmund Gottron Dictated by:   Christiana Fuchs, M.D. Admit Date:  09/22/2001                           History and Physical  DATE OF BIRTH:  1950-11-16  CHIEF COMPLAINT:  Left toe swelling and infection.  HISTORY OF PRESENT ILLNESS:  The patient is a 60 year old white male who presented to Urgent Care today for increasing erythema and swelling of the left third toe.  The patient was working around the house approximately three weeks ago and dropped a brick on the third toe on the left side.  The patient said the toe was sore, but no drainage or erythema until approximately four days ago.  On Friday, July 18, the patient presented to his work physician for increased swelling and erythema.  At that time, the patient denied any fever, chills, nausea, vomiting, or other systemic symptoms.  Incision and drainage was performed on the toe, and the patient was started on Keflex p.o.  Over the next two days, the patient starting noticing streaking up the dorsum of the foot, up the medial calf, and then up into the left groin.  Last night, the patient began to run a fever of 100.5 which was relieved by Tylenol.  The patient also began to notice inguinal adenopathy and tenderness which occurred overnight.  The patient was seen at Urgent Care today and admitted for IV antibiotics for left toe cellulitis refractory to p.o. antibiotics.  PAST MEDICAL HISTORY: 1. Chronic prostatitis for which he takes Flomax and Diazepam p.r.n. for    acute exacerbation. 2. Irritable bowel syndrome for which he sees Dr. Deatra Ina, and the patient    was given a 7-day course of mesalamine enema treatments.  The patient had    colonoscopy with biopsies performed June 2003. 3. Status post TURP procedure in 1998 by  Dr. Amalia Hailey. 4. Status post total lumbar laminectomy in the past.  CURRENT MEDICATIONS: 1. Lipitor 20 mg q.d. 2. Flomax p.r.n. 3. Diazepam p.r.n. 4. Keflex x 2 days now. 5. Mesalamine enema x 7 days.  ALLERGIES:  No known drug allergies.  SOCIAL HISTORY:  The patient lives here in [place] with his wife and works for [employer] as a Engineer, maintenance.  The patient travels a great deal.  The patient denies smoking, has minimal alcohol intake; the patient claims one or two beers two to three times per week.  No illicit drug use.  FAMILY HISTORY:  Positive family history in father for coronary artery disease, and paternal grandfather is also positive for coronary artery disease.  Uncle positive for colon cancer at the age of approximately 83.  No family history of diabetes.  PHYSICAL EXAMINATION:  VITAL SIGNS:  Temperature 98.8, blood pressure 126/81, pulse 94, respirations 14.  Weight 262 pounds.  He is afebrile.  GENERAL:  The patient is a pleasant male in no acute distress, resting quite comfortable.  NECK:  No cervical lymphadenopathy, no thyromegaly.  HEENT:  Pharynx is moist with no exudative lesions.  CARDIOVASCULAR:  Regular rate and rhythm with no murmurs, rubs, or gallops.  LUNGS:  Clear to auscultation bilaterally with no wheezing.  ABDOMEN:  Soft, mildly obese, nontender, nondistended, positive bowel sounds. There  are no palpable masses or hepatosplenomegaly.  EXTREMITIES:  Left third toe erythema with central healing ulcerated lesion approximately 1 cm in the center with positive streaking in dorsum of the foot and patchy painful erythema of medial calf and thigh.  Moderate left inguinal lymphadenopathy noted.  Streaking is warm to touch and painful on palpation. Erythema outlined in pen and no drainage noted from the third toe.  DIAGNOSTIC DATA:  Pertinent labs obtained at Urgent Care revealed a white blood count of 4.1, hemoglobin 14.8, platelets  270.  Outside x-ray preliminary review reveals no signs of osteomyelitis, moderate soft tissue swelling.  Blood cultures were drawn and are pending.  ASSESSMENT/PLAN:  This is a healthy 60 year old white male with history of traumatic lesion to the left third toe with resultant cellulitis refractory to p.o. antibiotics.  1. Cellulitis.  The patient is refractory to p.o. Keflex with streaking    and spreading of infection to the left inguinal area.  Moderate amount    of pain on ambulation.  Patient is afebrile now without drainage.  Will    get blood cultures x 2 today and treat with IV Ancef 1 g q.6h.  Will    monitor progression of erythema, and will get bone scan if no improvement    is noted in the first 24 to 48 hours.  X-ray currently negative for    osteomyelitis.  The patient will receive Percocet for pain as well as    heating pad for symptoms relief of erythema and pain in leg. 2. Hypercholesterolemia.  The patient will resume home medicine dose of    Lipitor 20 mg q.d.   No further action needed at this time. 3. Irritable bowel syndrome.  The patient will finish day #7 of his    mesalamine treatment tonight.  Will continue to monitor for diarrhea    and other signs.  DISPOSITION:  Will monitor closely given the patients traumatic cause of his cellulitis and will likely discharge patient home if patient shows good response to IV antibiotics for the first 24 to 36 hours. Dictated by:   Christiana Fuchs, M.D. Attending Physician:  Zigmund Gottron DD:  09/22/01 TD:  09/25/01 Job: [id number]

## 2010-12-01 LAB — CBC
HCT: 40.4
Hemoglobin: 13.9
MCHC: 34.4
MCV: 93.6
Platelets: 205
RBC: 4.31
RDW: 17.7 — ABNORMAL HIGH
WBC: 4.5

## 2010-12-01 LAB — BASIC METABOLIC PANEL
BUN: 5 — ABNORMAL LOW
CO2: 27
Calcium: 9.1
Chloride: 105
Creatinine, Ser: 0.82
GFR calc Af Amer: >60
GFR calc non Af Amer: >60
Glucose, Bld: 85
Potassium: 4.4
Sodium: 137

## 2010-12-01 LAB — TYPE AND SCREEN
ABO/RH(D): O POS
ABO/RH(D): O POS
Antibody Screen: POSITIVE
Antibody Screen: POSITIVE
DAT, IgG: NEGATIVE

## 2010-12-01 LAB — HEPATIC FUNCTION PANEL
ALT: 56 — ABNORMAL HIGH
AST: 110 — ABNORMAL HIGH
Albumin: 3.2 — ABNORMAL LOW
Alkaline Phosphatase: 120 — ABNORMAL HIGH
Bilirubin, Direct: 0.4 — ABNORMAL HIGH
Indirect Bilirubin: 1.2 — ABNORMAL HIGH
Total Bilirubin: 1.6 — ABNORMAL HIGH
Total Protein: 8.3

## 2010-12-01 LAB — PROTIME-INR
INR: 1.2
Prothrombin Time: 15.3 — ABNORMAL HIGH

## 2010-12-01 LAB — APTT: aPTT: 36

## 2010-12-09 LAB — PROTEIN ELECTROPH W RFLX QUANT IMMUNOGLOBULINS
Albumin ELP: 27 — ABNORMAL LOW
Alpha-1-Globulin: 2.6 — ABNORMAL LOW
Alpha-2-Globulin: 4.8 — ABNORMAL LOW
Beta 2: 4.8
Beta Globulin: 3.8 — ABNORMAL LOW
Gamma Globulin: 57 — ABNORMAL HIGH
M-Spike, %: NOT DETECTED
Total Protein ELP: 11.2 — ABNORMAL HIGH

## 2010-12-09 LAB — CBC
HCT: 40.7
Hemoglobin: 14.3
MCHC: 35.2
MCV: 90.7
Platelets: 196
RBC: 4.48
RDW: 15.7 — ABNORMAL HIGH
WBC: 4.7

## 2010-12-09 LAB — COMPREHENSIVE METABOLIC PANEL
ALT: 204 — ABNORMAL HIGH
AST: 304 — ABNORMAL HIGH
Albumin: 2.6 — ABNORMAL LOW
Alkaline Phosphatase: 179 — ABNORMAL HIGH
BUN: 3 — ABNORMAL LOW
CO2: 25
Calcium: 8.3 — ABNORMAL LOW
Chloride: 103
Creatinine, Ser: 1
GFR calc Af Amer: >60
GFR calc non Af Amer: >60
Glucose, Bld: 94
Potassium: 3.7
Sodium: 132 — ABNORMAL LOW
Total Bilirubin: 1.7 — ABNORMAL HIGH
Total Protein: 11.3 — ABNORMAL HIGH

## 2010-12-09 LAB — APTT: aPTT: 37

## 2010-12-09 LAB — ANA: Anti Nuclear Antibody(ANA): NEGATIVE

## 2010-12-09 LAB — PROTIME-INR
INR: 1.3
Prothrombin Time: 16.6 — ABNORMAL HIGH

## 2010-12-09 LAB — ANTI-SMITH ANTIBODY: ENA SM Ab Ser-aCnc: 0.2 AI (ref <1.0)

## 2011-04-23 ENCOUNTER — Ambulatory Visit: Payer: BC Managed Care – PPO

## 2011-04-23 ENCOUNTER — Ambulatory Visit (INDEPENDENT_AMBULATORY_CARE_PROVIDER_SITE_OTHER): Payer: BC Managed Care – PPO | Admitting: Family Medicine

## 2011-04-23 DIAGNOSIS — R509 Fever, unspecified: Secondary | ICD-10-CM

## 2011-04-23 DIAGNOSIS — IMO0001 Reserved for inherently not codable concepts without codable children: Secondary | ICD-10-CM

## 2011-04-23 DIAGNOSIS — R059 Cough, unspecified: Secondary | ICD-10-CM

## 2011-04-23 DIAGNOSIS — R6889 Other general symptoms and signs: Secondary | ICD-10-CM

## 2011-04-23 DIAGNOSIS — R05 Cough: Secondary | ICD-10-CM

## 2011-04-23 LAB — POCT CBC
Granulocyte percent: 70.3 %G (ref 37–80)
HCT, POC: 35.2 % — AB (ref 43.5–53.7)
Hemoglobin: 10.5 g/dL — AB (ref 14.1–18.1)
Lymph, poc: 0.7 (ref 0.6–3.4)
MCH, POC: 22.6 pg — AB (ref 27–31.2)
MCHC: 29.8 g/dL — AB (ref 31.8–35.4)
MCV: 75.7 fL — AB (ref 80–97)
MID (cbc): 0.5 (ref 0–0.9)
MPV: 7.9 fL (ref 0–99.8)
POC Granulocyte: 2.7 (ref 2–6.9)
POC LYMPH PERCENT: 17.6 %L (ref 10–50)
POC MID %: 12.1 %M — AB (ref 0–12)
Platelet Count, POC: 115 10*3/uL — AB (ref 142–424)
RBC: 4.65 M/uL — AB (ref 4.69–6.13)
RDW, POC: 21.5 %
WBC: 3.8 10*3/uL — AB (ref 4.6–10.2)

## 2011-04-23 LAB — POCT INFLUENZA A/B
Influenza A, POC: NEGATIVE
Influenza B, POC: NEGATIVE

## 2011-04-23 MED ORDER — LEVOFLOXACIN 500 MG PO TABS: 750.0000 mg | ORAL_TABLET | Freq: Every day | ORAL | Status: AC

## 2011-04-23 MED ORDER — IBUPROFEN 200 MG PO TABS
800.0000 mg | ORAL_TABLET | Freq: Once | ORAL | Status: AC
  Administered 2011-04-23: 800 mg via ORAL

## 2011-04-23 NOTE — Progress Notes (Signed)
[facility]:  Office Visit  Chief Complaint:  Chief Complaint  Patient presents with  . Cough  . Sore Throat  . Fever    HPI: Corey Shannon is a 61 y.o. male who complains of  1 day h/o fever, chills and cough, msk soreness. Was traveling [place] for work. Has had flu injection. Did not take OTC meds to treat. 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 Hyperlipidemia   . 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 Gluten intolerance    Past Surgical History  Procedure Date  . Spine surgery    History   Social History  . Marital Status: Married    Spouse Name: N/A    Number of Children: N/A  . Years of Education: N/A   Social History Main Topics  . Smoking status: Never Smoker   . Smokeless tobacco: None  . Alcohol Use: No  . Drug Use: No  . Sexually Active: None   Other Topics Concern  . None   Social History Narrative  . None   History reviewed. No pertinent family history. Allergies  Allergen Reactions  . Gluten   . Latex   . Sulfa Antibiotics    Prior to Admission medications   Medication Sig Start Date End Date Taking? Authorizing Provider  calcium & magnesium carbonates (MYLANTA) 311-232 MG per tablet Take 1 tablet by mouth daily.   Yes Historical Provider, MD  fish oil-omega-3 fatty acids 1000 MG capsule Take 2 g by mouth daily.   Yes Historical Provider, MD  Pseudoeph-Doxylamine-DM-APAP (NYQUIL) 60-7.08-02-998 MG/30ML LIQD Take by mouth.   Yes Historical Provider, MD  Vitamin D, Ergocalciferol, (DRISDOL) 50000 UNITS CAPS Take 50,000 Units by mouth.   Yes Historical Provider, MD     ROS: The patient denies night sweats, unintentional weight loss, chest pain, palpitations, wheezing, dyspnea on exertion, nausea, vomiting, abdominal pain, dysuria, hematuria, melena, numbness, weakness, or tingling. + fevers, chills, cough  All other systems have been reviewed and  were otherwise negative with the exception of those mentioned in the HPI and as above.    PHYSICAL EXAM: Filed Vitals:   04/23/11 1448  BP: 139/76  Pulse: 90  Temp: 102.9 F (39.4 C)  Resp: 16   Filed Vitals:   04/23/11 1448  Height: 5' 11.5" (1.816 m)  Weight: 261 lb 12.8 oz (118.752 kg)  Spo2   97% Body mass index is 36.00 kg/(m^2).  General: Alert, no acute distress, tired appearing HEENT:  Normocephalic, atraumatic, oropharynx patent. Tm normal.  Cardiovascular:  Regular rate and rhythm, no rubs murmurs or gallops.  No Carotid bruits, radial pulse intact. No pedal edema.  Respiratory: Clear to auscultation bilaterally.  No wheezes, rales, or rhonchi.  No cyanosis, no use of accessory musculature GI: No organomegaly, abdomen is soft and non-tender, positive bowel sounds.  No masses. Skin: No rashes. Neurologic: Facial musculature symmetric. Psychiatric: Patient is appropriate throughout our interaction. Lymphatic: No cervical lymphadenopathy Musculoskeletal: Gait intact.  LABS: Results for orders placed in visit on 04/23/11  POCT INFLUENZA A/B      Component Value Range   Influenza A, POC Negative     Influenza B, POC Negative    POCT CBC      Component Value Range   WBC 3.8 (*) 4.6 - 10.2 (K/uL)   Lymph, poc 0.7  0.6 - 3.4    POC LYMPH  PERCENT 17.6  10 - 50 (%L)   MID (cbc) 0.5  0 - 0.9    POC MID % 12.1 (*) 0 - 12 (%M)   POC Granulocyte 2.7  2 - 6.9    Granulocyte percent 70.3  37 - 80 (%G)   RBC 4.65 (*) 4.69 - 6.13 (M/uL)   Hemoglobin 10.5 (*) 14.1 - 18.1 (g/dL)   HCT, POC 35.2 (*) 43.5 - 53.7 (%)   MCV 75.7 (*) 80 - 97 (fL)   MCH, POC 22.6 (*) 27 - 31.2 (pg)   MCHC 29.8 (*) 31.8 - 35.4 (g/dL)   RDW, POC 21.5     Platelet Count, POC 115 (*) 142 - 424 (K/uL)   MPV 7.9  0 - 99.8 (fL)     EKG/XRAY:   Primary read interpreted by Dr. Marin Comment at [facility]. ? Left lobe infiltrate? Versus  Vascular congestion   ASSESSMENT/PLAN: Encounter Diagnoses  Name Primary?  .  Cough   . Flu-like symptoms   . Fever    Suspicious for PNA. CBC not reflective of infection since was recently on Imuran for autoimmune hepatitis.  Flu was negative.    1. Will treat with Levaquin 750 mg daily x 10 days for questionable infiltrate on xray. If no improvement then will give him Tamiflu within the next 24 hrs since he has been traveling overseas and is immune compromised. Patient given MOtrin in office and advise to take Motrin for fever and OTC meds for cough.       F/u by phone in 24 hrs  Kanai Hilger, [place], DO 04/23/2011 3:52 PM

## 2011-04-24 ENCOUNTER — Telehealth: Payer: Self-pay

## 2011-04-24 ENCOUNTER — Other Ambulatory Visit: Payer: Self-pay | Admitting: Family Medicine

## 2011-04-24 DIAGNOSIS — R6889 Other general symptoms and signs: Secondary | ICD-10-CM

## 2011-04-24 DIAGNOSIS — R05 Cough: Secondary | ICD-10-CM

## 2011-04-24 DIAGNOSIS — R059 Cough, unspecified: Secondary | ICD-10-CM

## 2011-04-24 MED ORDER — OSELTAMIVIR PHOSPHATE 75 MG PO CAPS: 75.0000 mg | ORAL_CAPSULE | Freq: Two times a day (BID) | ORAL | Status: AC

## 2011-04-24 MED ORDER — HYDROCOD POLST-CHLORPHEN POLST 10-8 MG/5ML PO LQCR: 5.0000 mL | Freq: Two times a day (BID) | ORAL | Status: DC | PRN

## 2011-04-24 MED ORDER — BENZONATATE 200 MG PO CAPS: 200.0000 mg | ORAL_CAPSULE | Freq: Two times a day (BID) | ORAL | Status: AC | PRN

## 2011-04-24 NOTE — Telephone Encounter (Signed)
Spoke with patient's wife and told her xray was normal. Will continue Levaquin x 7 days and start him on Tamiflu. I honestly think he has the flu even though the flu test was negative. He is febrile bt not taking TYlenol due to h/o Celiac sprue and autoimmune hepatitis. Told them to do a 1 time dose of 1 gram of tylenol and then 600 mg of motrin q 6 hrs to get fever under control. Will give rx for Tussionex and tessalon perles  to pick up at front desk.

## 2011-04-24 NOTE — Telephone Encounter (Signed)
.  [facility]     PTS WIFE CALLING TO SAY HUSBAND IS NOT DOING [facility] LIKE ADVICE   BEST PHONE [phone]

## 2011-04-24 NOTE — Telephone Encounter (Signed)
Pt wife is calling and would like for Dr Marin Comment to know pt has 103.5 fever please contact she said she left a message yesterday.

## 2011-04-24 NOTE — Telephone Encounter (Signed)
Spoke with wife and she is worried about pt's increase in temp and wants to make sure you don't think he should go to ER. Temp has been up to 103.5, never lower than 101. Fever did respond some to Motrin last night, but took motrin at 12:30 today, and temp spiked after that. Pt has however, only taking one 200 mg motrin d/t hx of autoimmune hepatitis. Pt coughing now productive w/ yellowish Gasparyan sputum. Used cold mist humidifier last night which helped his cough some. Pt requests cough medicine, esp for night, and please advise if fever is too high. At what level should they worry?

## 2011-04-25 ENCOUNTER — Telehealth: Payer: Self-pay | Admitting: Family Medicine

## 2011-04-25 NOTE — Telephone Encounter (Signed)
Attempted to call today to see how he is feeling , if fever still present but phone numbers are wrong.

## 2011-08-24 ENCOUNTER — Other Ambulatory Visit: Payer: Self-pay | Admitting: Dermatology

## 2012-01-15 ENCOUNTER — Other Ambulatory Visit: Payer: Self-pay | Admitting: Dermatology

## 2012-10-09 ENCOUNTER — Other Ambulatory Visit: Payer: Self-pay | Admitting: Gastroenterology

## 2012-11-29 ENCOUNTER — Encounter (HOSPITAL_COMMUNITY)
Admission: RE | Disposition: A | Discharge status: Home / Self Care | Payer: Self-pay | Source: Ambulatory Visit | Attending: Gastroenterology

## 2012-11-29 ENCOUNTER — Ambulatory Visit (HOSPITAL_COMMUNITY)
Admission: RE | Admit: 2012-11-29 | Discharge: 2012-11-29 | Disposition: A | Discharge status: Home / Self Care | Payer: BC Managed Care – PPO | Source: Ambulatory Visit | Attending: Gastroenterology | Admitting: Gastroenterology

## 2012-11-29 ENCOUNTER — Encounter (HOSPITAL_COMMUNITY): Payer: Self-pay | Admitting: *Deleted

## 2012-11-29 DIAGNOSIS — E785 Hyperlipidemia, unspecified: Secondary | ICD-10-CM | POA: Insufficient documentation

## 2012-11-29 DIAGNOSIS — K319 Disease of stomach and duodenum, unspecified: Secondary | ICD-10-CM | POA: Insufficient documentation

## 2012-11-29 DIAGNOSIS — K766 Portal hypertension: Secondary | ICD-10-CM | POA: Insufficient documentation

## 2012-11-29 DIAGNOSIS — K900 Celiac disease: Secondary | ICD-10-CM | POA: Insufficient documentation

## 2012-11-29 DIAGNOSIS — L88 Pyoderma gangrenosum: Secondary | ICD-10-CM | POA: Insufficient documentation

## 2012-11-29 DIAGNOSIS — K754 Autoimmune hepatitis: Secondary | ICD-10-CM | POA: Insufficient documentation

## 2012-11-29 DIAGNOSIS — I8500 Esophageal varices without bleeding: Secondary | ICD-10-CM | POA: Insufficient documentation

## 2012-11-29 HISTORY — PX: ESOPHAGOGASTRODUODENOSCOPY: SHX5428

## 2012-11-29 HISTORY — PX: ESOPHAGEAL BANDING: SHX5518

## 2012-11-29 SURGERY — EGD (ESOPHAGOGASTRODUODENOSCOPY)
Anesthesia: Moderate Sedation

## 2012-11-29 MED ORDER — SODIUM CHLORIDE 0.9 % IV SOLN
INTRAVENOUS | Status: DC
  Administered 2012-11-29: 500 mL via INTRAVENOUS

## 2012-11-29 MED ORDER — FENTANYL CITRATE 0.05 MG/ML IJ SOLN
INTRAMUSCULAR | Status: DC | PRN
  Administered 2012-11-29 (×2): 25 ug via INTRAVENOUS
  Administered 2012-11-29: 10 ug via INTRAVENOUS

## 2012-11-29 MED ORDER — MIDAZOLAM HCL 10 MG/2ML IJ SOLN
INTRAMUSCULAR | Status: AC
  Filled 2012-11-29: qty 4

## 2012-11-29 MED ORDER — MIDAZOLAM HCL 10 MG/2ML IJ SOLN
INTRAMUSCULAR | Status: DC | PRN
  Administered 2012-11-29 (×4): 2 mg via INTRAVENOUS

## 2012-11-29 MED ORDER — FENTANYL CITRATE 0.05 MG/ML IJ SOLN
INTRAMUSCULAR | Status: AC
  Filled 2012-11-29: qty 4

## 2012-11-29 NOTE — Op Note (Signed)
[place], [address]   OPERATIVE PROCEDURE REPORT  PATIENT: Corey Shannon, Corey Shannon  MR#: [id number] BIRTHDATE: 09-21-1950  GENDER: Male ENDOSCOPIST: Carol Ada, MD ASSISTANT:   Jiles Harold, technician and Gunnar Fusi, RN PROCEDURE DATE: 11/29/2012 PROCEDURE:   EGD w/ band ligation of varices ASA CLASS:   Class III INDICATIONS:Heartburn. MEDICATIONS: Versed 8 mg IV and Fentanyl 60 mcg IV TOPICAL ANESTHETIC:   Cetacaine Spray  DESCRIPTION OF PROCEDURE:   After the risks benefits and alternatives of the procedure were thoroughly explained, informed consent was obtained.  The Pentax Gastroscope [id number]  endoscope was introduced through the mouth  and advanced to the second portion of the duodenum Without limitations.      The instrument was slowly withdrawn as the mucosa was fully examined.    FINDINGS: In the distal esophagus large esohpageal avarices were identified.  There was a groupling of red wale signs at the GE junction without any stigmata of bleeding.  The endoscope was advanced into the gastric lumen and it was negative for any fundic varices.  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  The duodenum was normal.  After the upper GI inspection the scope was withdrawn and the Super 7 Speed bander was attached.  A total of 7 bands were successfully deployed.  No bleeding was precipitated and the patient tolerated the procedure well.          The scope was then withdrawn from the patient and the procedure terminated.  COMPLICATIONS: There were no complications.  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 2) Portal HTN gastroapthy. 3) No evidence of fundic varices.  RECOMMENDATIONS: 1) Continue with nadolol. 2) Repeat EGD with possible banding in 5-6 weeks after he returns from vacation.  _______________________________ eSignedCarol Ada, MD 11/29/2012 12:04 PM  cc: Earlie Raveling,  M.D.

## 2012-11-29 NOTE — Discharge Instructions (Signed)
Gastrointestinal Endoscopy °Care After °Refer to this sheet in the next few weeks. These instructions provide you with information on caring for yourself after your procedure. Your caregiver may also give you more specific instructions. Your treatment has been planned according to current medical practices, but problems sometimes occur. Call your caregiver if you have any problems or questions after your procedure. °HOME CARE INSTRUCTIONS °· If you were given medicine to help you relax (sedative), do not drive, operate machinery, or sign important documents for 24 hours. °· Avoid alcohol and hot or warm beverages for the first 24 hours after the procedure. °· Only take over-the-counter or prescription medicines for pain, discomfort, or fever as directed by your caregiver. You may resume taking your normal medicines unless your caregiver tells you otherwise. Ask your caregiver when you may resume taking medicines that may cause bleeding, such as aspirin, clopidogrel, or warfarin. °· You may return to your normal diet and activities on the day after your procedure, or as directed by your caregiver. Walking may help to reduce any bloated feeling in your abdomen. °· Drink enough fluids to keep your urine clear or pale yellow. °· You may gargle with salt water if you have a sore throat. °SEEK IMMEDIATE MEDICAL CARE IF: °· You have severe nausea or vomiting. °· You have severe abdominal pain, abdominal cramps that last longer than 6 hours, or abdominal swelling (distention). °· You have severe shoulder or back pain. °· You have trouble swallowing. °· You have shortness of breath, your breathing is shallow, or you are breathing faster than normal. °· You have a fever or a rapid heartbeat. °· You vomit blood or material that looks like coffee grounds. °· You have bloody, black, or tarry stools. °MAKE SURE YOU: °· Understand these instructions. °· Will watch your condition. °· Will get help right away if you are not doing  well or get worse. °Document Released: 10/05/2003 Document Revised: 08/22/2011 Document Reviewed: 05/23/2011 °ExitCare® Patient Information ©2014 [employer]. ° °

## 2012-11-29 NOTE — H&P (Signed)
Reason for Consult: Large esophageal varices secondary to AIH. Referring Physician: Earlie Raveling, M.D.  [name] HPI: This is a 62 year old male with AIH and findings of Grade III esophageal varices.  No prior history of bleeding and he is referred by Dr. Earlean Shawl for primary prophylaxis bleeding.  No complaints at this time.  Past Medical History  Diagnosis Date  . Hyperlipidemia   . 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 Gluten intolerance     Past Surgical History  Procedure Laterality Date  . Spine surgery      x 4, 9 disc fusion  . Cholecystectomy      History reviewed. No pertinent family history.  Social History:  reports that he has never smoked. He does not have any smokeless tobacco history on file. He reports that he does not drink alcohol or use illicit drugs.  Allergies:  Allergies  Allergen Reactions  . Gluten   . Latex   . Sulfa Antibiotics     Medications:  Scheduled:  Continuous: . sodium chloride 500 mL (11/29/12 0934)    No results found for this or any previous visit (from the past 24 hour(s)).   No results found.  ROS:  As stated above in the HPI otherwise negative.  Blood pressure 158/88, pulse 65, temperature 98.1 F (36.7 C), temperature source Oral, resp. rate 15, height 5' 11"  (1.803 m), weight 261 lb (118.389 kg), SpO2 98.00%.    PE: Gen: NAD, Alert and Oriented HEENT:  [place]/AT, EOMI Neck: Supple, no LAD Lungs: CTA Bilaterally CV: RRR without M/G/R ABM: Soft, NTND, +BS Ext: No C/C/E  Assessment/Plan: 1) Esophageal varices. 2) AIH. 3) UC with pyoderma gangranosum.   I discussed the procedure with the patient and let him know about the risks, namely bleeding during the procedure and afterwards.  There can be some chest pain with the procedure.  He understands and wishes to proceed.  Plan: 1) EGD with banding.  Gaylin Osoria D 11/29/2012, 11:30 AM

## 2012-12-02 ENCOUNTER — Encounter (HOSPITAL_COMMUNITY): Payer: Self-pay | Admitting: Gastroenterology

## 2012-12-05 ENCOUNTER — Other Ambulatory Visit: Payer: Self-pay | Admitting: Gastroenterology

## 2013-01-02 ENCOUNTER — Encounter (HOSPITAL_COMMUNITY): Payer: Self-pay | Admitting: Pharmacy Technician

## 2013-01-09 ENCOUNTER — Encounter (HOSPITAL_COMMUNITY): Payer: Self-pay | Admitting: *Deleted

## 2013-01-24 ENCOUNTER — Encounter (HOSPITAL_COMMUNITY): Payer: BC Managed Care – PPO | Admitting: Anesthesiology

## 2013-01-24 ENCOUNTER — Ambulatory Visit (HOSPITAL_COMMUNITY)
Admission: RE | Admit: 2013-01-24 | Discharge: 2013-01-24 | Disposition: A | Discharge status: Home / Self Care | Payer: BC Managed Care – PPO | Source: Ambulatory Visit | Attending: Gastroenterology | Admitting: Gastroenterology

## 2013-01-24 ENCOUNTER — Ambulatory Visit (HOSPITAL_COMMUNITY): Payer: BC Managed Care – PPO | Admitting: Anesthesiology

## 2013-01-24 ENCOUNTER — Encounter (HOSPITAL_COMMUNITY): Payer: Self-pay

## 2013-01-24 ENCOUNTER — Encounter (HOSPITAL_COMMUNITY)
Admission: RE | Disposition: A | Discharge status: Home / Self Care | Payer: BC Managed Care – PPO | Source: Ambulatory Visit | Attending: Gastroenterology

## 2013-01-24 DIAGNOSIS — Z9089 Acquired absence of other organs: Secondary | ICD-10-CM | POA: Insufficient documentation

## 2013-01-24 DIAGNOSIS — I8500 Esophageal varices without bleeding: Secondary | ICD-10-CM | POA: Insufficient documentation

## 2013-01-24 DIAGNOSIS — K219 Gastro-esophageal reflux disease without esophagitis: Secondary | ICD-10-CM | POA: Insufficient documentation

## 2013-01-24 DIAGNOSIS — E785 Hyperlipidemia, unspecified: Secondary | ICD-10-CM | POA: Insufficient documentation

## 2013-01-24 DIAGNOSIS — K754 Autoimmune hepatitis: Secondary | ICD-10-CM | POA: Insufficient documentation

## 2013-01-24 DIAGNOSIS — K900 Celiac disease: Secondary | ICD-10-CM | POA: Insufficient documentation

## 2013-01-24 HISTORY — PX: ESOPHAGEAL BANDING: SHX5518

## 2013-01-24 HISTORY — DX: Gastro-esophageal reflux disease without esophagitis: K21.9

## 2013-01-24 HISTORY — PX: ESOPHAGOGASTRODUODENOSCOPY: SHX5428

## 2013-01-24 SURGERY — EGD (ESOPHAGOGASTRODUODENOSCOPY)
Anesthesia: Monitor Anesthesia Care

## 2013-01-24 MED ORDER — FENTANYL CITRATE 0.05 MG/ML IJ SOLN
INTRAMUSCULAR | Status: AC
  Filled 2013-01-24: qty 2

## 2013-01-24 MED ORDER — PROPOFOL 10 MG/ML IV BOLUS
INTRAVENOUS | Status: AC
  Filled 2013-01-24: qty 20

## 2013-01-24 MED ORDER — LACTATED RINGERS IV SOLN
INTRAVENOUS | Status: DC
  Administered 2013-01-24 (×2): via INTRAVENOUS

## 2013-01-24 MED ORDER — PROPOFOL 10 MG/ML IV BOLUS
INTRAVENOUS | Status: DC | PRN
  Administered 2013-01-24: 50 mg via INTRAVENOUS

## 2013-01-24 MED ORDER — KETAMINE HCL 50 MG/ML IJ SOLN
INTRAMUSCULAR | Status: DC | PRN
  Administered 2013-01-24 (×2): 25 mg via INTRAMUSCULAR

## 2013-01-24 MED ORDER — PROPOFOL INFUSION 10 MG/ML OPTIME
INTRAVENOUS | Status: DC | PRN
  Administered 2013-01-24: 120 ug/kg/min via INTRAVENOUS

## 2013-01-24 MED ORDER — FENTANYL CITRATE 0.05 MG/ML IJ SOLN
25.0000 ug | INTRAMUSCULAR | Status: DC | PRN
  Administered 2013-01-24 (×2): 25 ug via INTRAVENOUS

## 2013-01-24 NOTE — OR Nursing (Signed)
Patient continues to have 8/10 epigastric pain after fentanyl 25 mcq IV injections.  He request to not receive additional fentanyl but instead go home and take Percocet for the pain.  MD made aware and is in agreement.  Prescription for Percocet given to his spouse.  Instructed to call with any problems.

## 2013-01-24 NOTE — Anesthesia Postprocedure Evaluation (Signed)
  Anesthesia Post-op Note  Patient: Corey Shannon  Procedure(s) Performed: Procedure(s) (LRB): ESOPHAGOGASTRODUODENOSCOPY (EGD) (N/A) ESOPHAGEAL BANDING (N/A)  Patient Location: PACU  Anesthesia Type: MAC  Level of Consciousness: awake and alert   Airway and Oxygen Therapy: Patient Spontanous Breathing  Post-op Pain: mild  Post-op Assessment: Post-op Vital signs reviewed, Patient's Cardiovascular Status Stable, Respiratory Function Stable, Patent Airway and No signs of Nausea or vomiting  Last Vitals:  Filed Vitals:   01/24/13 0941  BP: 157/97  Pulse:   Temp:   Resp: 16    Post-op Vital Signs: stable   Complications: No apparent anesthesia complications

## 2013-01-24 NOTE — Op Note (Signed)
[facility] [place], [address]   OPERATIVE PROCEDURE REPORT  PATIENT: Corey Shannon, Corey Shannon  MR#: [id number] BIRTHDATE: Mar 15, 1950  GENDER: Male ENDOSCOPIST: Carol Ada, MD ASSISTANT:   Remigio Eisenmenger, technician and Mariana Single, RN PROCEDURE DATE: 01/24/2013 PROCEDURE:   EGD w/ band ligation of varices ASA CLASS:   Class III INDICATIONS: Esophageal varices MEDICATIONS: MAC sedation, administered by CRNA TOPICAL ANESTHETIC:   Cetacaine Spray  DESCRIPTION OF PROCEDURE:   After the risks benefits and alternatives of the procedure were thoroughly explained, informed consent was obtained.  The Pentax Gastroscope [id number]  endoscope was introduced through the mouth  and advanced to the second portion of the duodenum Without limitations.      The instrument was slowly withdrawn as the mucosa was fully examined.      FINDINGS: In the distal esophagus large varices were again identified, but there was no stigmata of bleeding.  The gastric lumen exhibited portal hypertensive gastropathy.  No evidence of fundic varices.  The esophageal varices were banded.  Seven out of seven bands were successfully placed.   Retroflexed views revealed no abnormalities.     The scope was then withdrawn from the patient and the procedure terminated.  COMPLICATIONS: There were no complications. IMPRESSION: 1) Esophageal varices s/p banding. 2) Portal hypertensive gastropathy.  RECOMMENDATIONS: 1) Repeat EGD with banding in 2-3 weeks.  _______________________________ eSignedCarol Ada, MD 01/24/2013 9:26 AM

## 2013-01-24 NOTE — Anesthesia Preprocedure Evaluation (Addendum)
Anesthesia Evaluation  Patient identified by MRN, date of birth, ID band Patient awake    Reviewed: Allergy & Precautions, H&P , NPO status , Patient's Chart, lab work & pertinent test results, reviewed documented beta blocker date and time   Airway Mallampati: II TM Distance: >3 FB Neck ROM: full    Dental  (+) Caps and Dental Advisory Given Caps 2 upper front teeth:   Pulmonary neg pulmonary ROS,  breath sounds clear to auscultation  Pulmonary exam normal       Cardiovascular Exercise Tolerance: Good negative cardio ROS  Rhythm:regular Rate:Normal  Takes beta blocker to keep BP low because of varices   Neuro/Psych negative neurological ROS  negative psych ROS   GI/Hepatic negative GI ROS, GERD-  Medicated and Controlled,(+) Cirrhosis -  Esophageal Varices     , Hepatitis -, AutoimmuneAutoimmune hepatitis 2005.     Endo/Other  negative endocrine ROSMorbid obesity  Renal/GU negative Renal ROS  negative genitourinary   Musculoskeletal   Abdominal (+) + obese,   Peds  Hematology negative hematology ROS (+)   Anesthesia Other Findings   Reproductive/Obstetrics negative OB ROS                         Anesthesia Physical Anesthesia Plan  ASA: III  Anesthesia Plan: MAC   Post-op Pain Management:    Induction:   Airway Management Planned:   Additional Equipment:   Intra-op Plan:   Post-operative Plan:   Informed Consent: I have reviewed the patients History and Physical, chart, labs and discussed the procedure including the risks, benefits and alternatives for the proposed anesthesia with the patient or authorized representative who has indicated his/her understanding and acceptance.   Dental Advisory Given  Plan Discussed with: CRNA and Surgeon  Anesthesia Plan Comments:         Anesthesia Quick Evaluation

## 2013-01-24 NOTE — H&P (Signed)
  Corey Shannon HPI: The patient is here again for another session of esophageal variceal banding.  He has esophageal varices from AIH.  The first session went well.  No complaints at this time.  Past Medical History  Diagnosis Date  . Hyperlipidemia   . Gluten intolerance   . GERD (gastroesophageal reflux disease)   . Autoimmune hepatitis dx 2005    secondary to gluten allergy    Past Surgical History  Procedure Laterality Date  . Spine surgery      x 4, 9 disc fusion  . Cholecystectomy    . Esophagogastroduodenoscopy N/A 11/29/2012    Procedure: ESOPHAGOGASTRODUODENOSCOPY (EGD);  Surgeon: Beryle Beams, MD;  Location: [facility];  Service: Endoscopy;  Laterality: N/A;  . Esophageal banding N/A 11/29/2012    Procedure: ESOPHAGEAL BANDING;  Surgeon: Beryle Beams, MD;  Location: [facility];  Service: Endoscopy;  Laterality: N/A;    History reviewed. No pertinent family history.  Social History:  reports that he has never smoked. He has never used smokeless tobacco. He reports that he does not drink alcohol or use illicit drugs.  Allergies:  Allergies  Allergen Reactions  . Latex     Burns skin off  . Shellfish Allergy Anaphylaxis and Swelling    Legs swell and throat puffed out  . Gluten     Upset stomach   . Sulfa Antibiotics     Pass out    Medications:  Scheduled:  Continuous: . lactated ringers 125 mL/hr at 01/24/13 0817    No results found for this or any previous visit (from the past 24 hour(s)).   No results found.  ROS:  As stated above in the HPI otherwise negative.  Blood pressure 136/89, pulse 58, temperature 98.4 F (36.9 C), temperature source Oral, resp. rate 15, height 6' 4.5" (1.943 m), weight 274 lb (124.286 kg), SpO2 98.00%.    PE: Gen: NAD, Alert and Oriented HEENT:  Corey Shannon, EOMI Neck: Supple, no LAD Lungs: CTA Bilaterally CV: RRR without M/G/R ABM: Soft, NTND, +BS Ext: No C/C/E  Assessment/Plan: 1) AIH cirrhosis with esophageal  varices.  Plan: 1) EGD with banding.  Kadyn Chovan D 01/24/2013, 8:45 AM

## 2013-01-24 NOTE — Transfer of Care (Signed)
Immediate Anesthesia Transfer of Care Note  Patient: Corey Shannon  Procedure(s) Performed: Procedure(s) (LRB): ESOPHAGOGASTRODUODENOSCOPY (EGD) (N/A) ESOPHAGEAL BANDING (N/A)  Patient Location: PACU  Anesthesia Type: MAC  Level of Consciousness: sedated, patient cooperative and responds to stimulation  Airway & Oxygen Therapy: Patient Spontanous Breathing and Patient connected to face mask oxgen  Post-op Assessment: Report given to PACU RN and Post -op Vital signs reviewed and stable  Post vital signs: Reviewed and stable  Complications: No apparent anesthesia complications

## 2013-01-27 ENCOUNTER — Encounter (HOSPITAL_COMMUNITY): Payer: Self-pay | Admitting: Gastroenterology

## 2013-02-03 ENCOUNTER — Other Ambulatory Visit: Payer: Self-pay | Admitting: Gastroenterology

## 2013-02-10 ENCOUNTER — Encounter (HOSPITAL_COMMUNITY): Payer: Self-pay | Admitting: *Deleted

## 2013-02-11 ENCOUNTER — Encounter (HOSPITAL_COMMUNITY): Payer: Self-pay | Admitting: Pharmacy Technician

## 2013-02-12 NOTE — Progress Notes (Addendum)
01/15/2013-Labs ( Hepatic Panel and Lipid panel w/reflex) from Dr. Thurman Coyer on chart. Spoke with Dr. Landry Dyke on 02/12/2013 concerning lab work from this day and he informed me we can use these.

## 2013-02-21 ENCOUNTER — Encounter (HOSPITAL_COMMUNITY): Payer: BC Managed Care – PPO | Admitting: Anesthesiology

## 2013-02-21 ENCOUNTER — Encounter (HOSPITAL_COMMUNITY)
Admission: RE | Disposition: A | Discharge status: Home / Self Care | Payer: Self-pay | Source: Ambulatory Visit | Attending: Gastroenterology

## 2013-02-21 ENCOUNTER — Ambulatory Visit (HOSPITAL_COMMUNITY): Payer: BC Managed Care – PPO | Admitting: Anesthesiology

## 2013-02-21 ENCOUNTER — Ambulatory Visit (HOSPITAL_COMMUNITY)
Admission: RE | Admit: 2013-02-21 | Discharge: 2013-02-21 | Disposition: A | Discharge status: Home / Self Care | Payer: BC Managed Care – PPO | Source: Ambulatory Visit | Attending: Gastroenterology | Admitting: Gastroenterology

## 2013-02-21 ENCOUNTER — Encounter (HOSPITAL_COMMUNITY): Payer: Self-pay | Admitting: *Deleted

## 2013-02-21 DIAGNOSIS — K319 Disease of stomach and duodenum, unspecified: Secondary | ICD-10-CM | POA: Insufficient documentation

## 2013-02-21 DIAGNOSIS — K766 Portal hypertension: Secondary | ICD-10-CM | POA: Insufficient documentation

## 2013-02-21 DIAGNOSIS — I868 Varicose veins of other specified sites: Secondary | ICD-10-CM | POA: Insufficient documentation

## 2013-02-21 DIAGNOSIS — I8500 Esophageal varices without bleeding: Secondary | ICD-10-CM | POA: Insufficient documentation

## 2013-02-21 HISTORY — DX: Pneumonia, unspecified organism: J18.9

## 2013-02-21 HISTORY — DX: Unspecified osteoarthritis, unspecified site: M19.90

## 2013-02-21 HISTORY — PX: ESOPHAGEAL BANDING: SHX5518

## 2013-02-21 HISTORY — PX: ESOPHAGOGASTRODUODENOSCOPY: SHX5428

## 2013-02-21 SURGERY — EGD (ESOPHAGOGASTRODUODENOSCOPY)
Anesthesia: Monitor Anesthesia Care

## 2013-02-21 MED ORDER — MIDAZOLAM HCL 2 MG/2ML IJ SOLN
INTRAMUSCULAR | Status: AC
  Filled 2013-02-21: qty 2

## 2013-02-21 MED ORDER — KETAMINE HCL 10 MG/ML IJ SOLN
INTRAMUSCULAR | Status: DC | PRN
  Administered 2013-02-21 (×5): 10 mg via INTRAVENOUS

## 2013-02-21 MED ORDER — MIDAZOLAM HCL 5 MG/5ML IJ SOLN
INTRAMUSCULAR | Status: DC | PRN
  Administered 2013-02-21 (×2): 1 mg via INTRAVENOUS

## 2013-02-21 MED ORDER — SODIUM CHLORIDE 0.9 % IV SOLN: INTRAVENOUS | Status: DC

## 2013-02-21 MED ORDER — FENTANYL CITRATE 0.05 MG/ML IJ SOLN
INTRAMUSCULAR | Status: AC
  Filled 2013-02-21: qty 2

## 2013-02-21 MED ORDER — PROPOFOL INFUSION 10 MG/ML OPTIME
INTRAVENOUS | Status: DC | PRN
  Administered 2013-02-21: 140 ug/kg/min via INTRAVENOUS

## 2013-02-21 MED ORDER — ATROPINE SULFATE 0.4 MG/ML IJ SOLN
INTRAMUSCULAR | Status: AC
  Filled 2013-02-21: qty 1

## 2013-02-21 MED ORDER — PROPOFOL 10 MG/ML IV BOLUS
INTRAVENOUS | Status: AC
  Filled 2013-02-21: qty 20

## 2013-02-21 MED ORDER — LACTATED RINGERS IV SOLN
INTRAVENOUS | Status: DC
  Administered 2013-02-21: 1000 mL via INTRAVENOUS

## 2013-02-21 MED ORDER — EPHEDRINE SULFATE 50 MG/ML IJ SOLN
INTRAMUSCULAR | Status: AC
  Filled 2013-02-21: qty 1

## 2013-02-21 MED ORDER — SODIUM CHLORIDE 0.9 % IJ SOLN
INTRAMUSCULAR | Status: AC
  Filled 2013-02-21: qty 20

## 2013-02-21 MED ORDER — SODIUM CHLORIDE 0.9 % IJ SOLN
INTRAMUSCULAR | Status: AC
  Filled 2013-02-21: qty 10

## 2013-02-21 MED ORDER — KETAMINE HCL 50 MG/ML IJ SOLN
INTRAMUSCULAR | Status: AC
  Filled 2013-02-21: qty 10

## 2013-02-21 MED ORDER — FENTANYL CITRATE 0.05 MG/ML IJ SOLN
25.0000 ug | INTRAMUSCULAR | Status: DC | PRN
  Administered 2013-02-21: 25 ug via INTRAVENOUS

## 2013-02-21 NOTE — Anesthesia Postprocedure Evaluation (Signed)
  Anesthesia Post-op Note  Patient: Corey Shannon  Procedure(s) Performed: Procedure(s) (LRB): ESOPHAGOGASTRODUODENOSCOPY (EGD) (N/A) ESOPHAGEAL BANDING (N/A)  Patient Location: PACU  Anesthesia Type: MAC  Level of Consciousness: awake and alert   Airway and Oxygen Therapy: Patient Spontanous Breathing  Post-op Pain: mild  Post-op Assessment: Post-op Vital signs reviewed, Patient's Cardiovascular Status Stable, Respiratory Function Stable, Patent Airway and No signs of Nausea or vomiting  Last Vitals:  Filed Vitals:   02/21/13 0935  BP: 159/99  Pulse: 62  Temp: 36.5 C  Resp: 11    Post-op Vital Signs: stable   Complications: No apparent anesthesia complications

## 2013-02-21 NOTE — Op Note (Signed)
[place], [address]   OPERATIVE PROCEDURE REPORT  PATIENT: Corey Shannon, Corey Shannon  MR#: [id number] BIRTHDATE: [birth year]  GENDER: Male ENDOSCOPIST: Carol Ada, MD ASSISTANT:   Sharon Mt, Endo Technician, Ward Chatters, RN, BSN, and Corliss Parish, technician PROCEDURE DATE: 02/21/2013 PROCEDURE:   EGD with banding. ASA CLASS:   Class III INDICATIONS:Esophageal varices MEDICATIONS: MAC, CRNA TOPICAL ANESTHETIC:   None  DESCRIPTION OF PROCEDURE:   After the risks benefits and alternatives of the procedure were thoroughly explained, informed consent was obtained.  The [place] [id number]  endoscope was introduced through the mouth  and advanced to the second portion of the duodenum Without limitations.      The instrument was slowly withdrawn as the mucosa was fully examined.    FINDINGS: The esophageal varcies have markedly decreased in size and they are rated as being small.  Five out of seven bands were deployed.  The varices were harder to suction at this time.  In the gastric fundus there was the interval development of a small fundic varix.  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  No other abnormalities noted.          The scope was then withdrawn from the patient and the procedure terminated.  COMPLICATIONS: There were no complications. IMPRESSION: 1) Small esophageal varices s/p banding. 2) Small fundic varix. 3) Portal HTN gastropathy.  RECOMMENDATIONS: 1) Repeat EGD in 1 year unless there is decompensation. 2) Follow up with Dr. Earlean Shawl as scheduled.  _______________________________ eSignedCarol Ada, MD 02/21/2013 9:42 AM

## 2013-02-21 NOTE — Transfer of Care (Signed)
Immediate Anesthesia Transfer of Care Note  Patient: Corey Shannon  Procedure(s) Performed: Procedure(s): ESOPHAGOGASTRODUODENOSCOPY (EGD) (N/A) ESOPHAGEAL BANDING (N/A)  Patient Location: PACU and Endoscopy Unit  Anesthesia Type:MAC  Level of Consciousness: awake, sedated and patient cooperative  Airway & Oxygen Therapy: Patient Spontanous Breathing and Patient connected to nasal cannula oxygen  Post-op Assessment: Report given to PACU RN and Post -op Vital signs reviewed and stable  Post vital signs: Reviewed and stable  Complications: No apparent anesthesia complications

## 2013-02-21 NOTE — H&P (View-Only) (Signed)
01/15/2013-Labs ( Hepatic Panel and Lipid panel w/reflex) from Dr. Thurman Coyer on chart. Spoke with Dr. Landry Dyke on 02/12/2013 concerning lab work from this day and he informed me we can use these.

## 2013-02-21 NOTE — Anesthesia Preprocedure Evaluation (Addendum)
Anesthesia Evaluation  Patient identified by MRN, date of birth, ID band Patient awake    Reviewed: Allergy & Precautions, H&P , NPO status , Patient's Chart, lab work & pertinent test results, reviewed documented beta blocker date and time   Airway Mallampati: II TM Distance: >3 FB Neck ROM: full    Dental no notable dental hx. 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   Endo/Other  negative endocrine ROS  Renal/GU negative Renal ROS  negative genitourinary   Musculoskeletal   Abdominal   Peds  Hematology negative hematology ROS (+)   Anesthesia Other Findings   Reproductive/Obstetrics negative OB ROS                          Anesthesia Physical Anesthesia Plan  ASA: III  Anesthesia Plan: MAC   Post-op Pain Management:    Induction:   Airway Management Planned: Simple Face Mask  Additional Equipment:   Intra-op Plan:   Post-operative Plan:   Informed Consent: I have reviewed the patients History and Physical, chart, labs and discussed the procedure including the risks, benefits and alternatives for the proposed anesthesia with the patient or authorized representative who has indicated his/her understanding and acceptance.   Dental Advisory Given  Plan Discussed with: CRNA and Surgeon  Anesthesia Plan Comments:        Anesthesia Quick Evaluation

## 2013-02-21 NOTE — Interval H&P Note (Signed)
History and Physical Interval Note:  02/21/2013 8:46 AM  Corey Shannon  has presented today for surgery, with the diagnosis of esophageal varices  The various methods of treatment have been discussed with the patient and family. After consideration of risks, benefits and other options for treatment, the patient has consented to  Procedure(s): ESOPHAGOGASTRODUODENOSCOPY (EGD) (N/A) ESOPHAGEAL BANDING (N/A) as a surgical intervention .  The patient's history has been reviewed, patient examined, no change in status, stable for surgery.  I have reviewed the patient's chart and labs.  Questions were answered to the patient's satisfaction.     Jaceyon Strole D

## 2013-02-24 ENCOUNTER — Encounter (HOSPITAL_COMMUNITY): Payer: Self-pay | Admitting: Gastroenterology

## 2013-07-01 ENCOUNTER — Other Ambulatory Visit: Payer: Self-pay | Admitting: Interventional Cardiology

## 2013-07-03 ENCOUNTER — Other Ambulatory Visit: Payer: Self-pay | Admitting: Interventional Cardiology

## 2013-08-05 ENCOUNTER — Encounter: Payer: Self-pay | Admitting: Interventional Cardiology

## 2013-08-13 ENCOUNTER — Other Ambulatory Visit: Payer: Self-pay | Admitting: Gastroenterology

## 2013-08-13 ENCOUNTER — Telehealth: Payer: Self-pay

## 2013-08-13 DIAGNOSIS — K7460 Unspecified cirrhosis of liver: Secondary | ICD-10-CM

## 2013-08-13 MED ORDER — ATORVASTATIN CALCIUM 20 MG PO TABS: ORAL_TABLET | ORAL | Status: DC

## 2013-08-13 NOTE — Telephone Encounter (Signed)
Refilled lipitor 

## 2013-08-21 ENCOUNTER — Ambulatory Visit: Payer: BC Managed Care – PPO | Admitting: Interventional Cardiology

## 2013-08-21 ENCOUNTER — Ambulatory Visit
Admission: RE | Admit: 2013-08-21 | Discharge: 2013-08-21 | Disposition: A | Discharge status: Home / Self Care | Payer: BC Managed Care – PPO | Source: Ambulatory Visit | Attending: Gastroenterology | Admitting: Gastroenterology

## 2013-08-21 DIAGNOSIS — K7460 Unspecified cirrhosis of liver: Secondary | ICD-10-CM

## 2013-08-22 ENCOUNTER — Other Ambulatory Visit: Payer: Self-pay | Admitting: Interventional Cardiology

## 2013-09-29 ENCOUNTER — Ambulatory Visit (INDEPENDENT_AMBULATORY_CARE_PROVIDER_SITE_OTHER): Payer: BC Managed Care – PPO | Admitting: Interventional Cardiology

## 2013-09-29 ENCOUNTER — Encounter: Payer: Self-pay | Admitting: Interventional Cardiology

## 2013-09-29 VITALS — BP 117/63 | HR 57 | Ht 76.5 in | Wt 281.8 lb

## 2013-09-29 DIAGNOSIS — K754 Autoimmune hepatitis: Secondary | ICD-10-CM

## 2013-09-29 DIAGNOSIS — E7800 Pure hypercholesterolemia, unspecified: Secondary | ICD-10-CM

## 2013-09-29 DIAGNOSIS — K900 Celiac disease: Secondary | ICD-10-CM | POA: Insufficient documentation

## 2013-09-29 MED ORDER — ATORVASTATIN CALCIUM 20 MG PO TABS: ORAL_TABLET | ORAL | Status: DC

## 2013-09-29 NOTE — Progress Notes (Signed)
Patient ID: Corey Shannon, male   DOB: [birth year], 63 y.o.   MRN: [id number]    [address], [address] [place], [place]  [address] Phone: [phone] Fax:  [phone]  Date:  09/29/2013   ID:  Corey Shannon, DOB [birth year], MRN [id number]  PCP:  [name], MD   ASSESSMENT:  1. Celiac sprue 2. Autoimmune hepatitis 3. Elevated LDL cholesterol at target on atorvastatin 20 mg per day started by Dr. Lysle Rubens in September 2014  PLAN:  1. Continue atorvastatin 20 mg per day. We'll give a 2 month prescription after which Dr. Lysle Rubens will be responsible for continuing the medication they feel that this is necessary. It is really unclear to me why the patient was sent here to have the atorvastatin prescription refiled. The patient tells me I saw him greater than 20 years ago   SUBJECTIVE: Corey Shannon is a 63 y.o. male who is here today because Dr. Glenna Durand office stated that he needed to see me to have atorvastatin refill. When I called to find out why, realize that Dr. Lysle Rubens was out of the office for quite some time in [place]. The patient has no complaints. He was to be certain that he does not run out of his therapy. He states that Dr. Thana Farr knows he is on a statin and has no concern about his liver on the medication.   Wt Readings from Last 3 Encounters:  09/29/13 281 lb 12.8 oz (127.824 kg)  02/21/13 268 lb (121.564 kg)  02/21/13 268 lb (121.564 kg)     Past Medical History  Diagnosis Date  . Hyperlipidemia   . Gluten intolerance   . GERD (gastroesophageal reflux disease)   . 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 Pneumonia   . Arthritis     Current Outpatient Prescriptions  Medication Sig Dispense Refill  . atorvastatin (LIPITOR) 20 MG tablet TAKE 1 TABLET DAILY.  30 tablet  1  . azaTHIOprine (IMURAN) 50 MG tablet Take 50 mg by mouth daily with breakfast. 2 tablets daily for 100 mg      . Calcium Carb-Cholecalciferol (CALCIUM 1000 + D)  1000-800 MG-UNIT TABS Take 1,000 Units by mouth daily.      . Cholecalciferol (VITAMIN D-3) 5000 UNITS TABS Take 5,000 Units by mouth daily.      Marland Kitchen esomeprazole (NEXIUM) 40 MG capsule Take 40 mg by mouth daily before breakfast.      . fish oil-omega-3 fatty acids 1000 MG capsule Take 2 g by mouth daily.      . nadolol (CORGARD) 40 MG tablet Take 40 mg by mouth daily with breakfast.        No current facility-administered medications for this visit.    Allergies:    Allergies  Allergen Reactions  . Latex     Burns skin off  . Shellfish Allergy Anaphylaxis and Swelling    Legs swell and throat puffed out  . Gluten     Upset stomach   . Sulfa Antibiotics     Pass out    Social History:  The patient  reports that he has never smoked. He has never used smokeless tobacco. He reports that he drinks alcohol. He reports that he does not use illicit drugs.   ROS:  Please see the history of present illness.      All other systems reviewed and negative.   OBJECTIVE: VS:  BP 117/63  Pulse 57  Ht 6' 4.5" (1.943 m)  Wt 281 lb 12.8 oz (127.824 kg)  BMI 33.86 kg/m2 Well nourished, well developed, in no acute distress, obese but healthy appearing HEENT: normal Neck: JVD flat. Carotid bruit absent  Cardiac:  normal S1, S2; RRR; no murmur Lungs:  clear to auscultation bilaterally, no wheezing, rhonchi or rales Abd: soft, nontender, no hepatomegaly Ext: Edema none. Pulses 2+ Skin: warm and dry Neuro:  CNs 2-12 intact, no focal abnormalities noted  EKG:  Normal will not charge since the patient has no cardiac complaints and did not need an EKG       Signed, Illene Labrador III, MD 09/29/2013 3:37 PM

## 2013-09-29 NOTE — Patient Instructions (Signed)
Your physician recommends that you continue on your current medications as directed. Please refer to the Current Medication list given to you today.  A refill for Atorvastatin 93m daily has been sent to your pharmacy  Your physician recommends that you schedule a follow-up appointment with your PCP Dr.Husain

## 2014-01-28 ENCOUNTER — Ambulatory Visit (INDEPENDENT_AMBULATORY_CARE_PROVIDER_SITE_OTHER): Payer: BC Managed Care – PPO | Admitting: Family Medicine

## 2014-01-28 VITALS — BP 130/90 | HR 67 | Temp 97.6°F | Resp 16 | Ht 74.0 in | Wt 283.0 lb

## 2014-01-28 DIAGNOSIS — N410 Acute prostatitis: Secondary | ICD-10-CM

## 2014-01-28 DIAGNOSIS — R300 Dysuria: Secondary | ICD-10-CM

## 2014-01-28 DIAGNOSIS — K754 Autoimmune hepatitis: Secondary | ICD-10-CM

## 2014-01-28 LAB — COMPREHENSIVE METABOLIC PANEL
ALT: 20 U/L (ref 0–53)
AST: 45 U/L — ABNORMAL HIGH (ref 0–37)
Albumin: 3.8 g/dL (ref 3.5–5.2)
Alkaline Phosphatase: 62 U/L (ref 39–117)
BUN: 9 mg/dL (ref 6–23)
CO2: 27 mEq/L (ref 19–32)
Calcium: 8.6 mg/dL (ref 8.4–10.5)
Chloride: 103 mEq/L (ref 96–112)
Creat: 0.77 mg/dL (ref 0.50–1.35)
Glucose, Bld: 88 mg/dL (ref 70–99)
Potassium: 4 mEq/L (ref 3.5–5.3)
Sodium: 136 mEq/L (ref 135–145)
Total Bilirubin: 1 mg/dL (ref 0.2–1.2)
Total Protein: 7.6 g/dL (ref 6.0–8.3)

## 2014-01-28 LAB — POCT CBC
Granulocyte percent: 64.3 %G (ref 37–80)
HCT, POC: 39.1 % — AB (ref 43.5–53.7)
Hemoglobin: 12.6 g/dL — AB (ref 14.1–18.1)
Lymph, poc: 0.6 (ref 0.6–3.4)
MCH, POC: 30.6 pg (ref 27–31.2)
MCHC: 32.4 g/dL (ref 31.8–35.4)
MCV: 94.6 fL (ref 80–97)
MID (cbc): 0.2 (ref 0–0.9)
MPV: 6.9 fL (ref 0–99.8)
POC Granulocyte: 1.5 — AB (ref 2–6.9)
POC LYMPH PERCENT: 25.4 %L (ref 10–50)
POC MID %: 10.3 %M (ref 0–12)
Platelet Count, POC: 101 10*3/uL — AB (ref 142–424)
RBC: 4.13 M/uL — AB (ref 4.69–6.13)
RDW, POC: 17.6 %
WBC: 2.3 10*3/uL — AB (ref 4.6–10.2)

## 2014-01-28 LAB — POCT URINALYSIS DIPSTICK
Bilirubin, UA: NEGATIVE
Glucose, UA: NEGATIVE
Ketones, UA: NEGATIVE
Leukocytes, UA: NEGATIVE
Nitrite, UA: NEGATIVE
Protein, UA: NEGATIVE
Spec Grav, UA: <=1.005
Urobilinogen, UA: 0.2
pH, UA: 7

## 2014-01-28 LAB — POCT UA - MICROSCOPIC ONLY
Bacteria, U Microscopic: NEGATIVE
Casts, Ur, LPF, POC: NEGATIVE
Crystals, Ur, HPF, POC: NEGATIVE
Epithelial cells, urine per micros: NEGATIVE
Mucus, UA: NEGATIVE
RBC, urine, microscopic: NEGATIVE
WBC, Ur, HPF, POC: NEGATIVE
Yeast, UA: NEGATIVE

## 2014-01-28 MED ORDER — CIPROFLOXACIN HCL 500 MG PO TABS: 500.0000 mg | ORAL_TABLET | Freq: Two times a day (BID) | ORAL | Status: DC

## 2014-01-28 NOTE — Patient Instructions (Signed)

## 2014-01-28 NOTE — Progress Notes (Signed)
Chief Complaint:  Chief Complaint  Patient presents with  . Dysuria    x 3 days  . Urinary Frequency    HPI: Corey Shannon is a 63 y.o. male who is here for  3 day history of dysuria and increase urinary frequency, has sharp Pain down urethra, kidney stones in his 45s, he drank 3 gallons of water in the last 3 days but no relief, he travesl a lot for work and is sittign and has pain in his prostate when he sits. He gets chronic prostatitis, he had it for aboout 1 year  And comes and goes, when he gets it his PCP puts him on 4-6 weeks of cipro, he has no SEs with this.  He has associated diffuse lower pelvic pain, he denies nausea, vomting, fevers or chills.  No history of epididymitis, Has tried cranberry juice, No urethra dc   Dr Earlean Shawl is his GI doctor  Past Medical History  Diagnosis Date  . Hyperlipidemia   . Gluten intolerance   . GERD (gastroesophageal reflux disease)   . 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 Pneumonia   . Arthritis    Past Surgical History  Procedure Laterality Date  . Spine surgery      x 4, 9 disc fusion  . Cholecystectomy    . Esophagogastroduodenoscopy N/A 11/29/2012    Procedure: ESOPHAGOGASTRODUODENOSCOPY (EGD);  Surgeon: Beryle Beams, MD;  Location: [facility];  Service: Endoscopy;  Laterality: N/A;  . Esophageal banding N/A 11/29/2012    Procedure: ESOPHAGEAL BANDING;  Surgeon: Beryle Beams, MD;  Location: [facility];  Service: Endoscopy;  Laterality: N/A;  . Esophagogastroduodenoscopy N/A 01/24/2013    Procedure: ESOPHAGOGASTRODUODENOSCOPY (EGD);  Surgeon: Beryle Beams, MD;  Location: [facility];  Service: Endoscopy;  Laterality: N/A;  . Esophageal banding N/A 01/24/2013    Procedure: ESOPHAGEAL BANDING;  Surgeon: Beryle Beams, MD;  Location: [facility];  Service: Endoscopy;  Laterality: N/A;  . Back surgery    . Esophagogastroduodenoscopy N/A 02/21/2013    Procedure: ESOPHAGOGASTRODUODENOSCOPY  (EGD);  Surgeon: Beryle Beams, MD;  Location: [facility];  Service: Endoscopy;  Laterality: N/A;  . Esophageal banding N/A 02/21/2013    Procedure: ESOPHAGEAL BANDING;  Surgeon: Beryle Beams, MD;  Location: [facility];  Service: Endoscopy;  Laterality: N/A;   History   Social History  . Marital Status: Married    Spouse Name: N/A    Number of Children: N/A  . Years of Education: N/A   Social History Main Topics  . Smoking status: Never Smoker   . Smokeless tobacco: Never Used  . Alcohol Use: Yes     Comment: wine weekly-3 glasses  . Drug Use: No  . Sexual Activity: None   Other Topics Concern  . None   Social History Narrative   History reviewed. No pertinent family history. Allergies  Allergen Reactions  . Latex     Burns skin off  . Shellfish Allergy Anaphylaxis and Swelling    Legs swell and throat puffed out  . Gluten     Upset stomach   . Sulfa Antibiotics     Pass out   Prior to Admission medications   Medication Sig Start Date End Date Taking? Authorizing Provider  atorvastatin (LIPITOR) 20 MG tablet TAKE 1 TABLET DAILY. 09/29/13  Yes Belva Crome III, MD  azaTHIOprine (IMURAN) 50 MG tablet Take 50 mg by mouth daily with  breakfast. 2 tablets daily for 100 mg   Yes Historical Provider, MD  Calcium Carb-Cholecalciferol (CALCIUM 1000 + D) 1000-800 MG-UNIT TABS Take 1,000 Units by mouth daily.   Yes Historical Provider, MD  Cholecalciferol (VITAMIN D-3) 5000 UNITS TABS Take 5,000 Units by mouth daily.   Yes Historical Provider, MD  esomeprazole (NEXIUM) 40 MG capsule Take 40 mg by mouth daily before breakfast.   Yes Historical Provider, MD  fish oil-omega-3 fatty acids 1000 MG capsule Take 2 g by mouth daily.   Yes Historical Provider, MD  nadolol (CORGARD) 40 MG tablet Take 40 mg by mouth daily with breakfast.    Yes Historical Provider, MD  tadalafil (CIALIS) 5 MG tablet Take 5 mg by mouth daily as needed for erectile dysfunction.   Yes Historical Provider,  MD     ROS: The patient denies fevers, chills, night sweats, unintentional weight loss, chest pain, palpitations, wheezing, dyspnea on exertion, nausea, vomiting, abdominal pain, hematuria, melena, numbness, weakness, or tingling.   All other systems have been reviewed and were otherwise negative with the exception of those mentioned in the HPI and as above.    PHYSICAL EXAM: Filed Vitals:   01/28/14 0831  BP: 130/90  Pulse: 67  Temp: 97.6 F (36.4 C)  Resp: 16   Filed Vitals:   01/28/14 0831  Height: 6' 2"  (1.88 m)  Weight: 283 lb (128.368 kg)   Body mass index is 36.32 kg/(m^2).  General: Alert, no acute distress HEENT:  Normocephalic, atraumatic, oropharynx patent. EOMI, PERRLA Cardiovascular:  Regular rate and rhythm, no rubs murmurs or gallops.  Radial pulse intact. No pedal edema.  Respiratory: Clear to auscultation bilaterally.  No wheezes, rales, or rhonchi.  No cyanosis, no use of accessory musculature GI: No organomegaly, abdomen is soft and non-tender, positive bowel sounds.  No masses. Skin: No rashes. Neurologic: Facial musculature symmetric. Psychiatric: Patient is appropriate throughout our interaction. Lymphatic: No cervical lymphadenopathy Musculoskeletal: Gait intact. + tender prostate, no masses or lesions Testicles and scrotum are normal   LABS: Results for orders placed or performed in visit on 01/28/14  POCT urinalysis dipstick  Result Value Ref Range   Color, UA yellow    Clarity, UA clear    Glucose, UA neg    Bilirubin, UA neg    Ketones, UA neg    Spec Grav, UA <=1.005    Blood, UA trace-intact    pH, UA 7.0    Protein, UA neg    Urobilinogen, UA 0.2    Nitrite, UA neg    Leukocytes, UA Negative   POCT UA - Microscopic Only  Result Value Ref Range   WBC, Ur, HPF, POC neg    RBC, urine, microscopic neg    Bacteria, U Microscopic neg    Mucus, UA neg    Epithelial cells, urine per micros neg    Crystals, Ur, HPF, POC neg    Casts,  Ur, LPF, POC neg    Yeast, UA neg   POCT CBC  Result Value Ref Range   WBC 2.3 (A) 4.6 - 10.2 K/uL   Lymph, poc 0.6 0.6 - 3.4   POC LYMPH PERCENT 25.4 10 - 50 %L   MID (cbc) 0.2 0 - 0.9   POC MID % 10.3 0 - 12 %M   POC Granulocyte 1.5 (A) 2 - 6.9   Granulocyte percent 64.3 37 - 80 %G   RBC 4.13 (A) 4.69 - 6.13 M/uL   Hemoglobin 12.6 (A) 14.1 -  18.1 g/dL   HCT, POC 39.1 (A) 43.5 - 53.7 %   MCV 94.6 80 - 97 fL   MCH, POC 30.6 27 - 31.2 pg   MCHC 32.4 31.8 - 35.4 g/dL   RDW, POC 17.6 %   Platelet Count, POC 101 (A) 142 - 424 K/uL   MPV 6.9 0 - 99.8 fL     EKG/XRAY:   Primary read interpreted by Dr. Marin Comment at [facility].   ASSESSMENT/PLAN: Encounter Diagnoses  Name Primary?  . Dysuria   . Acute prostatitis Yes  . Autoimmune hepatitis    Rx cipro 500 mg bid x 28 days Labs pending Thrombocytopenia stable F/u prn  Gross sideeffects, risk and benefits, and alternatives of medications d/w patient. Patient is aware that all medications have potential sideeffects and we are unable to predict every sideeffect or drug-drug interaction that may occur.  LE, Buckner, DO 01/28/2014 9:49 AM

## 2014-02-10 ENCOUNTER — Encounter: Payer: Self-pay | Admitting: Family Medicine

## 2014-03-11 ENCOUNTER — Other Ambulatory Visit: Payer: Self-pay | Admitting: Gastroenterology

## 2014-03-12 ENCOUNTER — Encounter (HOSPITAL_COMMUNITY): Payer: Self-pay | Admitting: *Deleted

## 2014-03-20 ENCOUNTER — Ambulatory Visit (HOSPITAL_COMMUNITY): Payer: BLUE CROSS/BLUE SHIELD | Admitting: Anesthesiology

## 2014-03-20 ENCOUNTER — Ambulatory Visit (HOSPITAL_COMMUNITY)
Admission: RE | Admit: 2014-03-20 | Discharge: 2014-03-20 | Disposition: A | Discharge status: Home / Self Care | Payer: BLUE CROSS/BLUE SHIELD | Source: Ambulatory Visit | Attending: Gastroenterology | Admitting: Gastroenterology

## 2014-03-20 ENCOUNTER — Encounter (HOSPITAL_COMMUNITY): Payer: Self-pay | Admitting: *Deleted

## 2014-03-20 ENCOUNTER — Encounter (HOSPITAL_COMMUNITY)
Admission: RE | Disposition: A | Discharge status: Home / Self Care | Payer: Self-pay | Source: Ambulatory Visit | Attending: Gastroenterology

## 2014-03-20 DIAGNOSIS — Z91013 Allergy to seafood: Secondary | ICD-10-CM | POA: Insufficient documentation

## 2014-03-20 DIAGNOSIS — K754 Autoimmune hepatitis: Secondary | ICD-10-CM | POA: Diagnosis not present

## 2014-03-20 DIAGNOSIS — Z91040 Latex allergy status: Secondary | ICD-10-CM | POA: Insufficient documentation

## 2014-03-20 DIAGNOSIS — K900 Celiac disease: Secondary | ICD-10-CM | POA: Insufficient documentation

## 2014-03-20 DIAGNOSIS — M1990 Unspecified osteoarthritis, unspecified site: Secondary | ICD-10-CM | POA: Diagnosis not present

## 2014-03-20 DIAGNOSIS — K219 Gastro-esophageal reflux disease without esophagitis: Secondary | ICD-10-CM | POA: Insufficient documentation

## 2014-03-20 DIAGNOSIS — K766 Portal hypertension: Secondary | ICD-10-CM | POA: Diagnosis present

## 2014-03-20 DIAGNOSIS — I8510 Secondary esophageal varices without bleeding: Secondary | ICD-10-CM | POA: Insufficient documentation

## 2014-03-20 DIAGNOSIS — Z882 Allergy status to sulfonamides status: Secondary | ICD-10-CM | POA: Diagnosis not present

## 2014-03-20 DIAGNOSIS — E785 Hyperlipidemia, unspecified: Secondary | ICD-10-CM | POA: Diagnosis not present

## 2014-03-20 HISTORY — PX: ESOPHAGOGASTRODUODENOSCOPY (EGD) WITH PROPOFOL: SHX5813

## 2014-03-20 HISTORY — PX: ESOPHAGEAL BANDING: SHX5518

## 2014-03-20 SURGERY — ESOPHAGOGASTRODUODENOSCOPY (EGD) WITH PROPOFOL
Anesthesia: Monitor Anesthesia Care

## 2014-03-20 MED ORDER — LACTATED RINGERS IV SOLN
INTRAVENOUS | Status: DC
  Administered 2014-03-20: 12:00:00 via INTRAVENOUS

## 2014-03-20 MED ORDER — PROPOFOL 10 MG/ML IV BOLUS
INTRAVENOUS | Status: AC
  Filled 2014-03-20: qty 20

## 2014-03-20 MED ORDER — PROPOFOL 10 MG/ML IV BOLUS
INTRAVENOUS | Status: DC | PRN
  Administered 2014-03-20 (×2): 50 mg via INTRAVENOUS

## 2014-03-20 MED ORDER — LACTATED RINGERS IV SOLN
INTRAVENOUS | Status: DC
  Administered 2014-03-20: 1000 mL via INTRAVENOUS

## 2014-03-20 MED ORDER — SODIUM CHLORIDE 0.9 % IV SOLN: INTRAVENOUS | Status: DC

## 2014-03-20 SURGICAL SUPPLY — 15 items

## 2014-03-20 NOTE — Anesthesia Preprocedure Evaluation (Addendum)
Anesthesia Evaluation Anesthesia Physical Anesthesia Plan  ASA: III  Anesthesia Plan: MAC   Post-op Pain Management:    Induction:   Airway Management Planned: Simple Face Mask  Additional Equipment:   Intra-op Plan:   Post-operative Plan:   Informed Consent: I have reviewed the patients History and Physical, chart, labs and discussed the procedure including the risks, benefits and alternatives for the proposed anesthesia with the patient or authorized representative who has indicated his/her understanding and acceptance.   Dental Advisory Given  Plan Discussed with: CRNA and Surgeon  Anesthesia Plan Comments:         Anesthesia Quick Evaluation                                   Anesthesia Evaluation  Patient identified by MRN, date of birth, ID band Patient awake    Reviewed: Allergy & Precautions, H&P , NPO status , Patient's Chart, lab work & pertinent test results, reviewed documented beta blocker date and time   Airway Mallampati: II TM Distance: >3 FB Neck ROM: full    Dental no notable dental hx. 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   Endo/Other  negative endocrine ROS  Renal/GU negative Renal ROS  negative genitourinary   Musculoskeletal   Abdominal   Peds  Hematology negative hematology ROS (+)   Anesthesia Other Findings   Reproductive/Obstetrics negative OB ROS                          Anesthesia Physical Anesthesia Plan  ASA: III  Anesthesia Plan: MAC   Post-op  Pain Management:    Induction:   Airway Management Planned: Simple Face Mask  Additional Equipment:   Intra-op Plan:   Post-operative Plan:   Informed Consent: I have reviewed the patients History and Physical, chart, labs and discussed the procedure including the risks, benefits and alternatives for the proposed anesthesia with the patient or authorized representative who has indicated his/her understanding and acceptance.   Dental Advisory Given  Plan Discussed with: CRNA and Surgeon  Anesthesia Plan Comments:        Anesthesia Quick Evaluation

## 2014-03-20 NOTE — H&P (Signed)
  Corey Shannon HPI: This is a 64 year old male with a PMH of AIH cirrhosis and esophageal varices.  He was prophylactically banded last year and he is here for his one year follow up.  No changes during the interval time period.  Past Medical History  Diagnosis Date  . Hyperlipidemia   . Gluten intolerance   . GERD (gastroesophageal reflux disease)   . 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 Pneumonia   . Arthritis     Past Surgical History  Procedure Laterality Date  . Spine surgery      x 4, 9 disc fusion  . Cholecystectomy    . Esophagogastroduodenoscopy N/A 11/29/2012    Procedure: ESOPHAGOGASTRODUODENOSCOPY (EGD);  Surgeon: Beryle Beams, MD;  Location: [facility];  Service: Endoscopy;  Laterality: N/A;  . Esophageal banding N/A 11/29/2012    Procedure: ESOPHAGEAL BANDING;  Surgeon: Beryle Beams, MD;  Location: [facility];  Service: Endoscopy;  Laterality: N/A;  . Esophagogastroduodenoscopy N/A 01/24/2013    Procedure: ESOPHAGOGASTRODUODENOSCOPY (EGD);  Surgeon: Beryle Beams, MD;  Location: [facility];  Service: Endoscopy;  Laterality: N/A;  . Esophageal banding N/A 01/24/2013    Procedure: ESOPHAGEAL BANDING;  Surgeon: Beryle Beams, MD;  Location: [facility];  Service: Endoscopy;  Laterality: N/A;  . Back surgery    . Esophagogastroduodenoscopy N/A 02/21/2013    Procedure: ESOPHAGOGASTRODUODENOSCOPY (EGD);  Surgeon: Beryle Beams, MD;  Location: [facility];  Service: Endoscopy;  Laterality: N/A;  . Esophageal banding N/A 02/21/2013    Procedure: ESOPHAGEAL BANDING;  Surgeon: Beryle Beams, MD;  Location: [facility];  Service: Endoscopy;  Laterality: N/A;    History reviewed. No pertinent family history.  Social History:  reports that he has never smoked. He has never used smokeless tobacco. He reports that he drinks alcohol. He reports that he does not use illicit drugs.  Allergies:  Allergies  Allergen Reactions  . Latex    Burns skin off  . Shellfish Allergy Anaphylaxis and Swelling    Legs swell and throat puffed out  . Gluten     Upset stomach   . Sulfa Antibiotics     Pass out    Medications:  Scheduled:  Continuous: . sodium chloride    . lactated ringers      No results found for this or any previous visit (from the past 24 hour(s)).   No results found.  ROS:  As stated above in the HPI otherwise negative.  Blood pressure 124/75, pulse 51, temperature 98.1 F (36.7 C), temperature source Oral, resp. rate 12, height 6' 2"  (1.88 m), weight 123.378 kg (272 lb), SpO2 100 %.    PE: Gen: NAD, Alert and Oriented HEENT:  [place]/AT, EOMI Neck: Supple, no LAD Lungs: CTA Bilaterally CV: RRR without M/G/R ABM: Soft, NTND, +BS Ext: No C/C/E  Assessment/Plan: 1) AIH cirrhosis. 2) Personal history of esophageal varices.  Plan: 1) EGD with possible banding.  Corey Shannon D 03/20/2014, 12:27 PM

## 2014-03-20 NOTE — Discharge Instructions (Signed)
Esophagogastroduodenoscopy Care After Refer to this sheet in the next few weeks. These instructions provide you with information on caring for yourself after your procedure. Your caregiver may also give you more specific instructions. Your treatment has been planned according to current medical practices, but problems sometimes occur. Call your caregiver if you have any problems or questions after your procedure.  HOME CARE INSTRUCTIONS  Do not eat or drink anything until the numbing medicine (local anesthetic) has worn off and your gag reflex has returned. You will know that the local anesthetic has worn off when you can swallow comfortably.  Do not drive for 12 hours after the procedure or as directed by your caregiver.  Only take medicines as directed by your caregiver. SEEK MEDICAL CARE IF:   You cannot stop coughing.  You are not urinating at all or less than usual. SEEK IMMEDIATE MEDICAL CARE IF:  You have difficulty swallowing.  You cannot eat or drink.  You have worsening throat or chest pain.  You have dizziness, lightheadedness, or you faint.  You have nausea or vomiting.  You have chills.  You have a fever.  You have severe abdominal pain.  You have black, tarry, or bloody stools. Document Released: 02/07/2012 Document Reviewed: 02/07/2012 [facility] Patient Information 2015 [place]. This information is not intended to replace advice given to you by your health care provider. Make sure you discuss any questions you have with your health care provider.

## 2014-03-20 NOTE — Op Note (Signed)
[place], [id number]   ENDOSCOPY PROCEDURE REPORT  PATIENT: Corey Shannon, Corey Shannon  MR#: [id number] BIRTHDATE: [birth year] , 63  yrs. old GENDER: male ENDOSCOPIST:Corvette Orser Benson Norway, MD REFERRED BY: Richmond Campbell, M.D. PROCEDURE DATE:  04/08/14 PROCEDURE:   EGD, diagnostic ASA CLASS:    Class III INDICATIONS: History of esophageal varices secondary to AIH. MEDICATION: Propofol 100 mg IV TOPICAL ANESTHETIC:   none  DESCRIPTION OF PROCEDURE:   After the risks and benefits of the procedure were explained, informed consent was obtained.  The Pentax Gastroscope [id number]  endoscope was introduced through the mouth  and advanced to the second portion of the duodenum .  The instrument was slowly withdrawn as the mucosa was fully examined.   FINDINGS: In the distal esophagus scarring was identified from the prior esophageal banding.  There was no evidence of any significant esophageal varices.  The gastric mucosa revealed a mild portal HTN gastropathy.  No evidence of any fundic varices.  The duodenum was normal.          The scope was then withdrawn from the patient and the procedure completed.  COMPLICATIONS: There were no immediate complications.  ENDOSCOPIC IMPRESSION: 1) Minimal to nonexistent distal esophageal varices. 2) Mild portal HTN gastropathy.  RECOMMENDATIONS: 1) Repeat EGD in 2-3 years with Dr. Earlean Shawl. _______________________________ eSigned:  Carol Ada, MD April 08, 2014 12:56 PM    cc:  CPT CODES: ICD CODES:  The ICD and CPT codes recommended by this software are interpretations from the data that the clinical staff has captured with the software.  The verification of the translation of this report to the ICD and CPT codes and modifiers is the sole responsibility of the health care institution and practicing physician where this report was generated.  [employer] [place]. will not be held responsible for the validity  of the ICD and CPT codes included on this report.  AMA assumes no liability for data contained or not contained herein. CPT is a Designer, television/film set of the [employer].

## 2014-03-20 NOTE — Transfer of Care (Signed)
Immediate Anesthesia Transfer of Care Note  Patient: Corey Shannon  Procedure(s) Performed: Procedure(s): ESOPHAGOGASTRODUODENOSCOPY (EGD) WITH PROPOFOL (N/A) ESOPHAGEAL BANDING (N/A)  Patient Location: PACU  Anesthesia Type:MAC  Level of Consciousness: awake, alert , oriented and patient cooperative  Airway & Oxygen Therapy: Patient Spontanous Breathing and Patient connected to nasal cannula oxygen  Post-op Assessment: Report given to PACU RN and Post -op Vital signs reviewed and stable  Post vital signs: Reviewed and stable  Complications: No apparent anesthesia complications

## 2014-03-20 NOTE — Anesthesia Postprocedure Evaluation (Signed)
  Anesthesia Post-op Note  Patient: Corey Shannon  Procedure(s) Performed: Procedure(s): ESOPHAGOGASTRODUODENOSCOPY (EGD) WITH PROPOFOL (N/A) ESOPHAGEAL BANDING (N/A) Patient is awake and responsive. Pain and nausea are reasonably well controlled. Vital signs are stable and clinically acceptable. Oxygen saturation is clinically acceptable. There are no apparent anesthetic complications at this time. Patient is ready for discharge.

## 2014-03-23 ENCOUNTER — Encounter (HOSPITAL_COMMUNITY): Payer: Self-pay | Admitting: Gastroenterology

## 2014-04-10 ENCOUNTER — Telehealth: Payer: Self-pay | Admitting: Interventional Cardiology

## 2014-04-10 ENCOUNTER — Ambulatory Visit (INDEPENDENT_AMBULATORY_CARE_PROVIDER_SITE_OTHER): Payer: BLUE CROSS/BLUE SHIELD

## 2014-04-10 VITALS — BP 108/74 | HR 52 | Resp 12

## 2014-04-10 DIAGNOSIS — R0789 Other chest pain: Secondary | ICD-10-CM

## 2014-04-10 NOTE — Telephone Encounter (Signed)
Pt c/o of Chest Pain: STAT if CP now or developed within 24 hours  1. Are you having CP right now? Yes. Pt describes it as a chest tightness/soreness but not exactly chest pain.  2. Are you experiencing any other symptoms (ex. SOB, nausea, vomiting, sweating)? No  3. How long have you been experiencing CP? 1 day and a half  4. Is your CP continuous or coming and going? Continuous  5. Have you taken Nitroglycerin? No ?

## 2014-04-10 NOTE — Telephone Encounter (Signed)
I discussed this pt with Dr Tamala Julian and he felt like the pt should contact his PCP for further evaluation of symptoms.  Per Dr Tamala Julian the pt can have an EKG performed in our office today since the pt may not be able to see his PCP until next week. The pt will come into the office today at 11:00 for EKG.

## 2014-04-10 NOTE — Progress Notes (Signed)
Per phone note: Pt complains of pressure in his chest that has been constant for 2 days. The pt denies chest pain and describes more of a pressure and soreness in chest. The pt also complains of tingling in both arms which he has experienced in the past but this episode "feels different". The pt has nerve issues in his back which he has attributed to arm tingling in the past. The pt can press on his chest and cause soreness. The pt does notice improvement in symptoms when he stands up. The pt has tried Aleve without improvement. The pt denies any nausea, vomiting, dizziness and there is no change in his SOB from normal.  EKG performed and shows SB rate 52.   Dr Tamala Julian reviewed EKG and no changes made at this time.

## 2014-04-10 NOTE — Telephone Encounter (Signed)
I spoke with the pt and he complains of pressure in his chest that has been constant for 2 days. The pt denies chest pain and describes more of a pressure and soreness in chest. The pt also complains of tingling in both arms which he has experienced in the past but this episode "feels different".  The pt has nerve issues in his back which he has attributed to arm tingling in the past.  The pt can press on his chest and cause soreness. The pt does notice improvement in symptoms when he stands up.  The pt has tried Aleve without improvement. The pt denies any nausea, vomiting, dizziness and there is no change in his SOB from normal. The pt does not have a history of CAD. I will discuss this pt with Dr Tamala Julian.

## 2014-04-10 NOTE — Patient Instructions (Signed)
Please contact PCP for further evaluation of symptoms.

## 2014-04-28 ENCOUNTER — Other Ambulatory Visit: Payer: Self-pay | Admitting: Gastroenterology

## 2014-04-28 DIAGNOSIS — C220 Liver cell carcinoma: Secondary | ICD-10-CM

## 2014-05-06 ENCOUNTER — Ambulatory Visit
Admission: RE | Admit: 2014-05-06 | Discharge: 2014-05-06 | Disposition: A | Discharge status: Home / Self Care | Payer: BLUE CROSS/BLUE SHIELD | Source: Ambulatory Visit | Attending: Gastroenterology | Admitting: Gastroenterology

## 2014-05-06 DIAGNOSIS — C220 Liver cell carcinoma: Secondary | ICD-10-CM

## 2015-03-09 ENCOUNTER — Telehealth: Payer: Self-pay | Admitting: Interventional Cardiology

## 2015-03-09 NOTE — Telephone Encounter (Signed)
Pt has some light chest pain off and on and called to request an appt with Smith-he has a cxl 03-11-15 at 215 which I scheduled-offered sooner with PA and he wanted to talk to nurse to discuss if he indeed should come sooner -pls advise

## 2015-03-09 NOTE — Telephone Encounter (Signed)
Returned pt call.  Pt sts that he has been having some intermittent mild chest discomfort, no other associating symptoms Pt denies having active chest pain now. Pt denies nausea and vomiting. Pt sts that the discomfort is usually worse after he eats, and is better with movement. 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 Pt has an appt already scheduled on 03/11/15 with Dr.Smith. Adv pt to keep that appt. Adv pt to call our office is symptoms worsen and to seek emergency care for prolonged chest pian. Pt agreeable with plan and verbalized understanding.

## 2015-03-10 NOTE — Progress Notes (Signed)
Cardiology Office Note   Date:  03/10/2015   ID:  Corey Shannon, DOB [birth year], MRN [id number]  PCP:  [name], MD  Cardiologist:  Sinclair Grooms, MD   Chief Complaint  Patient presents with  . Chest Pain      History of Present Illness: Corey Shannon is a 65 y.o. male who presents for hyperlipidemia, hypertension, and chest discomfort.  The patient has had prior episodes of chest discomfort. Last evaluation was with stress testing remotely. Over the past 7-10 days he has had interscapular pain left arm numbness and a continuous aching discomfort in the chest. His been continuously present. It is not aggravated by walking or physical activity. No associated dyspnea or physical limitations. No diaphoresis or sweating.  Past Medical History  Diagnosis Date  . Hyperlipidemia   . Gluten intolerance   . GERD (gastroesophageal reflux disease)   . 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 Pneumonia   . Arthritis     Past Surgical History  Procedure Laterality Date  . Spine surgery      x 4, 9 disc fusion  . Cholecystectomy    . Esophagogastroduodenoscopy N/A 11/29/2012    Procedure: ESOPHAGOGASTRODUODENOSCOPY (EGD);  Surgeon: Beryle Beams, MD;  Location: [facility];  Service: Endoscopy;  Laterality: N/A;  . Esophageal banding N/A 11/29/2012    Procedure: ESOPHAGEAL BANDING;  Surgeon: Beryle Beams, MD;  Location: [facility];  Service: Endoscopy;  Laterality: N/A;  . Esophagogastroduodenoscopy N/A 01/24/2013    Procedure: ESOPHAGOGASTRODUODENOSCOPY (EGD);  Surgeon: Beryle Beams, MD;  Location: [facility];  Service: Endoscopy;  Laterality: N/A;  . Esophageal banding N/A 01/24/2013    Procedure: ESOPHAGEAL BANDING;  Surgeon: Beryle Beams, MD;  Location: [facility];  Service: Endoscopy;  Laterality: N/A;  . Back surgery    . Esophagogastroduodenoscopy N/A 02/21/2013    Procedure: ESOPHAGOGASTRODUODENOSCOPY (EGD);  Surgeon: Beryle Beams, MD;  Location: [facility];  Service: Endoscopy;  Laterality: N/A;  . Esophageal banding N/A 02/21/2013    Procedure: ESOPHAGEAL BANDING;  Surgeon: Beryle Beams, MD;  Location: [facility];  Service: Endoscopy;  Laterality: N/A;  . Esophagogastroduodenoscopy (egd) with propofol N/A 03/20/2014    Procedure: ESOPHAGOGASTRODUODENOSCOPY (EGD) WITH PROPOFOL;  Surgeon: Beryle Beams, MD;  Location: [facility];  Service: Endoscopy;  Laterality: N/A;  . Esophageal banding N/A 03/20/2014    Procedure: ESOPHAGEAL BANDING;  Surgeon: Beryle Beams, MD;  Location: [facility];  Service: Endoscopy;  Laterality: N/A;     Current Outpatient Prescriptions  Medication Sig Dispense Refill  . aspirin 81 MG tablet Take 81 mg by mouth daily.    Marland Kitchen atorvastatin (LIPITOR) 20 MG tablet Take 20 mg by mouth daily.    Marland Kitchen azaTHIOprine (IMURAN) 50 MG tablet Take 100 mg by mouth daily with breakfast.     . Calcium Carb-Cholecalciferol (CALCIUM 1000 + D) 1000-800 MG-UNIT TABS Take 1,000 Units by mouth daily.    . Cholecalciferol (VITAMIN D-3) 5000 UNITS TABS Take 5,000 Units by mouth daily.    . ciprofloxacin (CIPRO) 500 MG tablet Take 500 mg by mouth 2 (two) times daily.    Marland Kitchen esomeprazole (NEXIUM) 40 MG capsule Take 40 mg by mouth daily before breakfast.    . fish oil-omega-3 fatty acids 1000 MG capsule Take 2 g by mouth daily.    . nadolol (CORGARD) 40 MG tablet Take 40 mg by mouth daily with breakfast.     .  tadalafil (CIALIS) 5 MG tablet Take 5 mg by mouth daily as needed for erectile dysfunction.    . tamsulosin (FLOMAX) 0.4 MG CAPS capsule Take 0.4 mg by mouth daily.     No current facility-administered medications for this visit.    Allergies:   Latex; Shellfish allergy; Gluten; and Sulfa antibiotics    Social History:  The patient  reports that he has never smoked. He has never used smokeless tobacco. He reports that he drinks alcohol. He reports that he does not use illicit drugs.   Family  History:  The patient's family history is not on file.    ROS:  Please see the history of present illness.   Otherwise, review of systems are positive for abdominal discomfort and tingling, back pain, has about, easy bruising..   All other systems are reviewed and negative.    PHYSICAL EXAM: VS:  There were no vitals taken for this visit. , BMI There is no weight on file to calculate BMI. GEN: Well nourished, well developed, in no acute distress HEENT: normal Neck: no JVD, carotid bruits, or masses Cardiac: RRR.  There is no murmur, rub, or gallop. There is no edema. Respiratory:  clear to auscultation bilaterally, normal work of breathing. GI: soft, nontender, nondistended, + BS MS: no deformity or atrophy Skin: warm and dry, no rash Neuro:  Strength and sensation are intact Psych: euthymic mood, full affect   EKG:  EKG is ordered today. The ekg reveals sinus bradycardia   Recent Labs: No results found for requested labs within last 365 days.    Lipid Panel    Component Value Date/Time   CHOL  05/04/2009 0445    95        ATP III CLASSIFICATION:  <200     mg/dL   Desirable  200-239  mg/dL   Borderline High  >=240    mg/dL   High          TRIG 43 05/04/2009 0445   HDL 25* 05/04/2009 0445   CHOLHDL 3.8 05/04/2009 0445   VLDL 9 05/04/2009 0445   LDLCALC  05/04/2009 0445    61        Total Cholesterol/HDL:CHD Risk Coronary Heart Disease Risk Table                     Men   Women  1/2 Average Risk   3.4   3.3  Average Risk       5.0   4.4  2 X Average Risk   9.6   7.1  3 X Average Risk  23.4   11.0        Use the calculated Patient Ratio above and the CHD Risk Table to determine the patient's CHD Risk.        ATP III CLASSIFICATION (LDL):  <100     mg/dL   Optimal  100-129  mg/dL   Near or Above                    Optimal  130-159  mg/dL   Borderline  160-189  mg/dL   High  >190     mg/dL   Very High      Wt Readings from Last 3 Encounters:  03/20/14 272  lb (123.378 kg)  01/28/14 283 lb (128.368 kg)  09/29/13 281 lb 12.8 oz (127.824 kg)      Other studies Reviewed: Additional studies/ records that were reviewed today include: none.  The findings include none.    ASSESSMENT AND PLAN:  1. Chest pressure with interscapular and left arm radiation Rule out aortic disease. Discomfort has been ongoing now for greater than a week, making coronary disease/ACS very unlikely given normal EKG. Consider thoracic spine disease.  2. Hypercholesteremia Currently on therapy with omega-3 fatty acids  3. Autoimmune hepatitis ([place]) Not addressed  4. Tortuous aorta by chest x-ray 2013    Current medicines are reviewed at length with the patient today.  The patient has the following concerns regarding medicines: None.  The following changes/actions have been instituted:    Chest CT with contrast to rule out aortic dissection  Exercise treadmill test  Consider thoracic spine evaluation to rule out disc disease given significant lumbar degeneration if above 2 tests are unremarkable  Labs/ tests ordered today include:  No orders of the defined types were placed in this encounter.     Disposition:   FU with HS in PRN    Signed, Sinclair Grooms, MD  03/10/2015 6:48 PM    [address], [place], [place]  [address] Phone: [phone]; Fax: [phone]

## 2015-03-11 ENCOUNTER — Encounter: Payer: Self-pay | Admitting: Interventional Cardiology

## 2015-03-11 ENCOUNTER — Ambulatory Visit (INDEPENDENT_AMBULATORY_CARE_PROVIDER_SITE_OTHER): Payer: BLUE CROSS/BLUE SHIELD | Admitting: Interventional Cardiology

## 2015-03-11 VITALS — BP 116/76 | HR 53 | Ht 76.0 in | Wt 282.8 lb

## 2015-03-11 DIAGNOSIS — E7800 Pure hypercholesterolemia, unspecified: Secondary | ICD-10-CM

## 2015-03-11 DIAGNOSIS — R0789 Other chest pain: Secondary | ICD-10-CM | POA: Diagnosis not present

## 2015-03-11 DIAGNOSIS — K754 Autoimmune hepatitis: Secondary | ICD-10-CM | POA: Diagnosis not present

## 2015-03-11 NOTE — Patient Instructions (Signed)
Medication Instructions:  Your physician recommends that you continue on your current medications as directed. Please refer to the Current Medication list given to you today.   Labwork: Bmet today  Testing/Procedures: Your physician has requested that you have an exercise tolerance test. For further information please visit [email or web address]. Please also follow instruction sheet, as given.  Non-Cardiac CT Angiography (CTA), is a special type of CT scan that uses a computer to produce multi-dimensional views of major blood vessels throughout the body. In CT angiography, a contrast material is injected through an IV to help visualize the blood vessels   Follow-Up: Your physician recommends that you schedule a follow-up appointment pending results   Any Other Special Instructions Will Be Listed Below (If Applicable).     If you need a refill on your cardiac medications before your next appointment, please call your pharmacy.

## 2015-03-12 ENCOUNTER — Ambulatory Visit (INDEPENDENT_AMBULATORY_CARE_PROVIDER_SITE_OTHER)
Admission: RE | Admit: 2015-03-12 | Discharge: 2015-03-12 | Disposition: A | Discharge status: Home / Self Care | Payer: BLUE CROSS/BLUE SHIELD | Source: Ambulatory Visit | Attending: Interventional Cardiology | Admitting: Interventional Cardiology

## 2015-03-12 DIAGNOSIS — R0789 Other chest pain: Secondary | ICD-10-CM

## 2015-03-12 LAB — BASIC METABOLIC PANEL
BUN: 10 mg/dL (ref 7–25)
CO2: 26 mmol/L (ref 20–31)
Calcium: 9.1 mg/dL (ref 8.6–10.3)
Chloride: 103 mmol/L (ref 98–110)
Creat: 0.93 mg/dL (ref 0.70–1.25)
Glucose, Bld: 80 mg/dL (ref 65–99)
Potassium: 3.8 mmol/L (ref 3.5–5.3)
Sodium: 138 mmol/L (ref 135–146)

## 2015-03-12 MED ORDER — IOHEXOL 350 MG/ML SOLN
100.0000 mL | Freq: Once | INTRAVENOUS | Status: AC | PRN
  Administered 2015-03-12: 100 mL via INTRAVENOUS

## 2015-03-16 ENCOUNTER — Telehealth: Payer: Self-pay

## 2015-03-16 NOTE — Telephone Encounter (Signed)
Pt aware of CT results. Pt sts that he is followed by Dr.Medoff for his Liver cirrhosis Adv pt that I will fwd results to both Dr.Husain and Dr.Medoff. Pt  Verbalized understanding.

## 2015-03-16 NOTE — Telephone Encounter (Signed)
-----   Message from Belva Crome, MD sent at 03/13/2015  3:50 PM EST ----- No tear in aorta and no aneurysm. Aorta is mildly dilated but not aneurysmal. CT suggests the presence of cirrhosis. If there is alcohol use, lease stop and speak with PCP about this right away.

## 2015-03-18 ENCOUNTER — Ambulatory Visit (INDEPENDENT_AMBULATORY_CARE_PROVIDER_SITE_OTHER): Payer: BLUE CROSS/BLUE SHIELD

## 2015-03-18 ENCOUNTER — Other Ambulatory Visit: Payer: Self-pay | Admitting: Physician Assistant

## 2015-03-18 ENCOUNTER — Encounter: Payer: BLUE CROSS/BLUE SHIELD | Admitting: Physician Assistant

## 2015-03-18 DIAGNOSIS — R9439 Abnormal result of other cardiovascular function study: Secondary | ICD-10-CM

## 2015-03-18 DIAGNOSIS — R0789 Other chest pain: Secondary | ICD-10-CM | POA: Diagnosis not present

## 2015-03-18 LAB — EXERCISE TOLERANCE TEST
Estimated workload: 7.8 METS
Exercise duration (min): 6 min
Exercise duration (sec): 38 s
MPHR: 158 {beats}/min
Peak HR: 106 {beats}/min
Percent HR: 67 %
RPE: 13
Rest HR: 57 {beats}/min

## 2015-03-23 ENCOUNTER — Telehealth (HOSPITAL_COMMUNITY): Payer: Self-pay | Admitting: *Deleted

## 2015-03-23 NOTE — Telephone Encounter (Signed)
Patient given detailed instructions per Myocardial Perfusion Study Information Sheet for the test on 03/25/15 at 0715. Patient notified to arrive 15 minutes early and that it is imperative to arrive on time for appointment to keep from having the test rescheduled.  If you need to cancel or reschedule your appointment, please call the office within 24 hours of your appointment. Failure to do so may result in a cancellation of your appointment, and a $50 no show fee. Patient verbalized understanding.Dicy Smigel, [place]

## 2015-03-25 ENCOUNTER — Encounter: Payer: Self-pay | Admitting: Physician Assistant

## 2015-03-25 ENCOUNTER — Ambulatory Visit (HOSPITAL_COMMUNITY): Payer: BLUE CROSS/BLUE SHIELD | Attending: Cardiovascular Disease

## 2015-03-25 DIAGNOSIS — I10 Essential (primary) hypertension: Secondary | ICD-10-CM | POA: Diagnosis not present

## 2015-03-25 DIAGNOSIS — R0789 Other chest pain: Secondary | ICD-10-CM | POA: Diagnosis present

## 2015-03-25 DIAGNOSIS — I517 Cardiomegaly: Secondary | ICD-10-CM | POA: Insufficient documentation

## 2015-03-25 DIAGNOSIS — R9439 Abnormal result of other cardiovascular function study: Secondary | ICD-10-CM

## 2015-03-25 LAB — MYOCARDIAL PERFUSION IMAGING
LV dias vol: 178 mL
LV sys vol: 70 mL
Peak HR: 73 {beats}/min
RATE: 0.31
Rest HR: 56 {beats}/min
SDS: 2
SRS: 4
SSS: 6
TID: 1.03

## 2015-03-25 MED ORDER — TECHNETIUM TC 99M SESTAMIBI GENERIC - CARDIOLITE
31.8000 | Freq: Once | INTRAVENOUS | Status: AC | PRN
  Administered 2015-03-25: 31.8 via INTRAVENOUS

## 2015-03-25 MED ORDER — REGADENOSON 0.4 MG/5ML IV SOLN
0.4000 mg | Freq: Once | INTRAVENOUS | Status: AC
  Administered 2015-03-25: 0.4 mg via INTRAVENOUS

## 2015-03-25 MED ORDER — TECHNETIUM TC 99M SESTAMIBI GENERIC - CARDIOLITE
10.2000 | Freq: Once | INTRAVENOUS | Status: AC | PRN
  Administered 2015-03-25: 10 via INTRAVENOUS

## 2015-03-26 ENCOUNTER — Telehealth: Payer: Self-pay | Admitting: *Deleted

## 2015-03-26 NOTE — Telephone Encounter (Signed)
Pt notified of myoview results and findings by phone with verbal understanding. Advised per PA and Dr. Tamala Julian need appt to discuss myoview further, possible cath needed. Pt agreeable to plan of care appt 1/27 w/PA same day Dr. Tamala Julian is in the office.

## 2015-03-29 NOTE — Progress Notes (Signed)
Cardiology Office Note:    Date:  03/30/2015   ID:  Corey Shannon, DOB 11/19/1950, MRN [id number]  PCP:  [name], MD  Cardiologist:  Dr. Daneen Schick   Electrophysiologist:  N/a GI:  Dr. Earlean Shawl Neurosurgeon:  Dr. Atilano Ina [facility])   Chief Complaint  Patient presents with  . Chest Pain    Follow Up    History of Present Illness:    Corey Shannon is a 65 y.o. male with a hx of HTN, HL, autoimmune hepatitis. He has a hx of portal HTN and esophageal varices.  He notes chronic, mild thrombocytopenia.  He is followed by Dr. Earlean Shawl.  He was evaluated by Dr. Tamala Julian 03/10/15.  Patient complained of chest discomfort. Chest CT and exercise treadmill test were arranged.  CT demonstrated coronary calcification. There was no evidence of aortic dissection. There was an ascending thoracic aortic aneurysm 4.2 cm. There was also evidence of hepatic cirrhosis with collateral veins and splenomegaly consistent with portal hypertension. ETT demonstrated poor exercise capacity. There was submaximal exercise with 1 mm or less ST depression.  Nuclear stress test was arranged. This demonstrated EF 61%. Study was overall low-risk but it was notable for mild inferior/inferolateral ischemia. He returns for FU.    Here with his wife.  He has had interscapular back pain with radiation to his chest and bilateral arms since lat Nov 2016.  Positional changes make it worse.  He has tingling in his hands.  Exertion does not make is worse.  He notices increased symptoms in his hands while driving.  He denies associated nausea, diaphoresis.  He notes DOE. This is chronic and stable.  He denies orthopnea, PND, edema.  He did have a syncopal episode around [place]. He was taking Christmas lights down outside.  He had not eaten in hours.  He felt dizzy while standing at the sink and passed out (he thinks just for a couple seconds). He denies injury.     Past Medical History  Diagnosis Date  . Hyperlipidemia   .  Gluten intolerance   . GERD (gastroesophageal reflux disease)   . Autoimmune hepatitis ([place]) dx 2005    secondary to gluten allergy  . Pneumonia   . Arthritis   . History of nuclear stress test     Myoview 1/17: EF 61%, inferior and inferolateral ischemia, low risk    Past Surgical History  Procedure Laterality Date  . Spine surgery      x 4, 9 disc fusion  . Cholecystectomy    . Esophagogastroduodenoscopy N/A 11/29/2012    Procedure: ESOPHAGOGASTRODUODENOSCOPY (EGD);  Surgeon: Beryle Beams, MD;  Location[facility];  Service: Endoscopy;  Laterality: N/A;  . Esophageal banding N/A 11/29/2012    Procedure: ESOPHAGEAL BANDING;  Surgeon: Beryle Beams, MD;  Location: [facility];  Service: Endoscopy;  Laterality: N/A;  . Esophagogastroduodenoscopy N/A 01/24/2013    Procedure: ESOPHAGOGASTRODUODENOSCOPY (EGD);  Surgeon: Beryle Beams, MD;  Location: [facility];  Service: Endoscopy;  Laterality: N/A;  . Esophageal banding N/A 01/24/2013    Procedure: ESOPHAGEAL BANDING;  Surgeon: Beryle Beams, MD;  Location: [facility];  Service: Endoscopy;  Laterality: N/A;  . Back surgery    . Esophagogastroduodenoscopy N/A 02/21/2013    Procedure: ESOPHAGOGASTRODUODENOSCOPY (EGD);  Surgeon: Beryle Beams, MD;  Location: [facility];  Service: Endoscopy;  Laterality: N/A;  . Esophageal banding N/A 02/21/2013    Procedure: ESOPHAGEAL BANDING;  Surgeon: Beryle Beams, MD;  Location: [facility];  Service: Endoscopy;  Laterality: N/A;  . Esophagogastroduodenoscopy (egd) with propofol N/A 03/20/2014    Procedure: ESOPHAGOGASTRODUODENOSCOPY (EGD) WITH PROPOFOL;  Surgeon: Beryle Beams, MD;  Location: [facility];  Service: Endoscopy;  Laterality: N/A;  . Esophageal banding N/A 03/20/2014    Procedure: ESOPHAGEAL BANDING;  Surgeon: Beryle Beams, MD;  Location: [facility];  Service: Endoscopy;  Laterality: N/A;    Current Medications: Outpatient Prescriptions Prior to Visit  Medication  Sig Dispense Refill  . alfuzosin (UROXATRAL) 10 MG 24 hr tablet Take 10 mg by mouth daily.    Marland Kitchen aspirin 81 MG tablet Take 81 mg by mouth daily.    Marland Kitchen atorvastatin (LIPITOR) 20 MG tablet Take 20 mg by mouth daily.    Marland Kitchen azaTHIOprine (IMURAN) 50 MG tablet Take 100 mg by mouth daily with breakfast.     . esomeprazole (NEXIUM) 40 MG capsule Take 40 mg by mouth daily before breakfast.    . fish oil-omega-3 fatty acids 1000 MG capsule Take 2 g by mouth daily.    . nadolol (CORGARD) 40 MG tablet Take 40 mg by mouth daily with breakfast.     . tadalafil (CIALIS) 5 MG tablet Take 5 mg by mouth daily as needed for erectile dysfunction.    Marland Kitchen XIIDRA 5 % SOLN Place 1 drop into both eyes daily.    . Calcium Carb-Cholecalciferol (CALCIUM 1000 + D) 1000-800 MG-UNIT TABS Take 1,000 Units by mouth daily. Reported on 03/30/2015    . Cholecalciferol (VITAMIN D-3) 5000 UNITS TABS Take 5,000 Units by mouth daily. Reported on 03/30/2015     No facility-administered medications prior to visit.     Allergies:   Latex; Shellfish allergy; Gluten; and Sulfa antibiotics   Social History   Social History  . Marital Status: Married    Spouse Name: N/A  . Number of Children: N/A  . Years of Education: N/A   Social History Main Topics  . Smoking status: Never Smoker   . Smokeless tobacco: Never Used  . Alcohol Use: Yes     Comment: wine weekly-3 glasses  . Drug Use: No  . Sexual Activity: Not Asked   Other Topics Concern  . None   Social History Narrative     Family History:  The patient's family history includes Alzheimer's disease in his father; Heart attack in his father; Heart disease in his father; Osteoporosis in his mother; Stroke in his father.   ROS:   Please see the history of present illness.    Review of Systems  Cardiovascular: Positive for chest pain.  Musculoskeletal: Positive for back pain.  All other systems reviewed and are negative.   Physical Exam:    VS:  BP 98/62 mmHg  Pulse 58   Ht 6' 4"  (1.93 m)  Wt 285 lb 9.6 oz (129.547 kg)  BMI 34.78 kg/m2   GEN: Well nourished, well developed, in no acute distress HEENT: normal Neck: no JVD, no masses Cardiac: Normal S1/S2, RRR; no murmurs, rubs, or gallops, no edema;  no carotid bruits,   Respiratory:  clear to auscultation bilaterally; no wheezing, rhonchi or rales GI: soft, nontender, nondistended, + BS MS: no deformity or atrophy Skin: warm and dry, no rash Neuro: no focal deficits  Psych: Alert and oriented x 3, normal affect  Wt Readings from Last 3 Encounters:  03/30/15 285 lb 9.6 oz (129.547 kg)  03/25/15 282 lb (127.914 kg)  03/11/15 282 lb 12.8 oz (128.277 kg)  Studies/Labs Reviewed:    EKG:  EKG is  ordered today.  The ekg ordered today demonstrates sinus brady, HR 59, normal axis, QTc 431 ms, no change from prior tracing.   Recent Labs: 03/11/2015: BUN 10; Creat 0.93; Potassium 3.8; Sodium 138   Recent Lipid Panel    Component Value Date/Time   CHOL  05/04/2009 0445    95        ATP III CLASSIFICATION:  <200     mg/dL   Desirable  200-239  mg/dL   Borderline High  >=240    mg/dL   High          TRIG 43 05/04/2009 0445   HDL 25* 05/04/2009 0445   CHOLHDL 3.8 05/04/2009 0445   VLDL 9 05/04/2009 0445   LDLCALC  05/04/2009 0445    61        Total Cholesterol/HDL:CHD Risk Coronary Heart Disease Risk Table                     Men   Women  1/2 Average Risk   3.4   3.3  Average Risk       5.0   4.4  2 X Average Risk   9.6   7.1  3 X Average Risk  23.4   11.0        Use the calculated Patient Ratio above and the CHD Risk Table to determine the patient's CHD Risk.        ATP III CLASSIFICATION (LDL):  <100     mg/dL   Optimal  100-129  mg/dL   Near or Above                    Optimal  130-159  mg/dL   Borderline  160-189  mg/dL   High  >190     mg/dL   Very High    Additional studies/ records that were reviewed today include:   Chest CTA 03/12/15 IMPRESSION: Mild coronary artery  calcifications are noted suggesting coronary artery disease. There is no evidence of thoracic aortic dissection. 4.2 cm ascending thoracic aortic aneurysm is noted. Recommend annual imaging follow up by CTA or MRA. This recommendation follows 2010 ACCF/AHA/AATS/ACR/ASA/SCA/SCAI/SIR/STS/SVM Guidelines for the Diagnosis and Management of Patients with Thoracic Aortic Disease.   Circulation. 2010; 121: I627-O350. Hepatic cirrhosis is noted with collateral veins and splenomegaly consistent with portal hypertension.  ETT 03/18/15  Poor exercise capacity. He c/o chest pain present for 2 weeks before test. No change in symptoms. Normal BP response to exercise. Test stopped prior to target HR due to back pain.   There was 1 mm or less of ST depression in inferior leads at submaximal HR.  Arrange Lexiscan Myoview.  Myoview 03/25/15 Low risk stress nuclear study with a small, severe, reversible inferior basal defect and a small, moderate, fixed distal septal defect; findings consistent with mild ischemia in the inferior/inferior lateral basal wall; EF 61 with normal wall motion; mild LVE.   ASSESSMENT:    1. Other chest pain   2. Hypercholesteremia   3. Coronary artery calcification seen on CT scan   4. Autoimmune hepatitis ([place])   5. Syncope, unspecified syncope type     PLAN:    In order of problems listed above:  1. Chest pain - Patient has symptoms that are quite atypical for ischemia. His stress test is low risk and does not suggest significant disease.  ECG is normal. I reviewed his case  with Dr. Daneen Schick and he also saw the patient. After discussing further options which include "watchful waiting" vs cardiac cath, recommendation is to pursue no further cardiac testing.  The patient has been advised to FU with his PCP or neurosurgeon to further evaluate his spine. It is suspected that his symptoms are likely related to this.  If his symptoms continue without clear objective findings  on noncardiac testing, he should follow-up with cardiology.  2. HL - Managed by primary care. Continue statin.  3. Cardiac calcification - Calcification noted on CT scan. Continue risk factor modification with aspirin, beta blocker, statin.   4. Autoimmune hepatitis - Continue follow-up with gastroenterology.  5. Syncope - The patient's blood pressure runs low. He is on nadolol secondary to portal hypertension and a history of esophageal varices. I suspect that hypotension played a role in his recent syncope.  EF was normal on recent nuclear stress test.  Syncope was likely vasovagal. I have asked him to review this with his gastroenterologist to see if it is acceptable to decrease his nadolol from 40 mg down to 20 mg daily.    Medication Adjustments/Labs and Tests Ordered: Current medicines are reviewed at length with the patient today.  Concerns regarding medicines are outlined above.  Medication changes, Labs and Tests ordered today are outlined in the Patient Instructions noted below. Patient Instructions  Medication Instructions:  PER DR. Tamala Julian AND Journey Ratterman, PAC PLEASE CONTACT DR. MEDOFF TO SEE IF OK TO DECREASE NADOLOL TO 20 MG DAILY  Labwork: NONE  Testing/Procedures: NONE  Follow-Up: AS NEEDED WITH DR. Tamala Julian  Any Other Special Instructions Will Be Listed Below (If Applicable).  FOLLOW UP WITH PRIMARY CARE AND NEUROLOGY ABOUT BACK PAIN   If you need a refill on your cardiac medications before your next appointment, please call your pharmacy.    Signed, Richardson Dopp, PA-C  03/30/2015 9:15 AM    [facility] [address], [place], [place]  [address] Phone: [phone]; Fax: [phone]

## 2015-03-30 ENCOUNTER — Encounter: Payer: Self-pay | Admitting: Physician Assistant

## 2015-03-30 ENCOUNTER — Encounter: Payer: Self-pay | Admitting: *Deleted

## 2015-03-30 ENCOUNTER — Ambulatory Visit (INDEPENDENT_AMBULATORY_CARE_PROVIDER_SITE_OTHER): Payer: BLUE CROSS/BLUE SHIELD | Admitting: Physician Assistant

## 2015-03-30 VITALS — BP 98/62 | HR 58 | Ht 76.0 in | Wt 285.6 lb

## 2015-03-30 DIAGNOSIS — R0789 Other chest pain: Secondary | ICD-10-CM | POA: Diagnosis not present

## 2015-03-30 DIAGNOSIS — E7800 Pure hypercholesterolemia, unspecified: Secondary | ICD-10-CM

## 2015-03-30 DIAGNOSIS — K754 Autoimmune hepatitis: Secondary | ICD-10-CM | POA: Diagnosis not present

## 2015-03-30 DIAGNOSIS — I2510 Atherosclerotic heart disease of native coronary artery without angina pectoris: Secondary | ICD-10-CM

## 2015-03-30 DIAGNOSIS — R55 Syncope and collapse: Secondary | ICD-10-CM

## 2015-03-30 MED ORDER — CALCIUM CARB-CHOLECALCIFEROL 1000-800 MG-UNIT PO TABS: 1.0000 | ORAL_TABLET | Freq: Every day | ORAL | Status: AC

## 2015-03-30 NOTE — Patient Instructions (Signed)
Medication Instructions:  PER DR. Tamala Julian AND SCOTT WEAVER, PAC PLEASE CONTACT DR. MEDOFF TO SEE IF OK TO DECREASE NADOLOL TO 20 MG DAILY  Labwork: NONE  Testing/Procedures: NONE  Follow-Up: AS NEEDED WITH DR. Tamala Julian  Any Other Special Instructions Will Be Listed Below (If Applicable).  FOLLOW UP WITH PRIMARY CARE AND NEUROLOGY ABOUT BACK PAIN   If you need a refill on your cardiac medications before your next appointment, please call your pharmacy.

## 2015-03-31 ENCOUNTER — Telehealth: Payer: Self-pay | Admitting: Interventional Cardiology

## 2015-03-31 DIAGNOSIS — I712 Thoracic aortic aneurysm, without rupture, unspecified: Secondary | ICD-10-CM

## 2015-03-31 NOTE — Telephone Encounter (Signed)
New message      Pt has a question regarding the interpretation on his ct scan regarding his aorta.  Please call

## 2015-04-01 NOTE — Telephone Encounter (Signed)
Follow up     Returned a call to the nurse

## 2015-04-01 NOTE — Telephone Encounter (Signed)
2nd attempt returned pt call. [email or web address]

## 2015-04-01 NOTE — Telephone Encounter (Signed)
Spoke with pt.  Answered pt questions about his CT. Pt had no further questions at this time. Adv pt that we will mail a reminder letter for pt to have CT repeated in 1 year. Pt sts that Dr.Medoff recommend he reduce Cogard to 98m daily. Pt ed list update. FYI fwd to Dr.Smith at pt rqst

## 2015-04-01 NOTE — Telephone Encounter (Signed)
Follow up ° ° ° ° ° °Returning a call to the nurse °

## 2015-04-02 ENCOUNTER — Ambulatory Visit: Payer: BLUE CROSS/BLUE SHIELD | Admitting: Physician Assistant

## 2015-06-09 ENCOUNTER — Other Ambulatory Visit: Payer: Self-pay | Admitting: Gastroenterology

## 2015-06-09 DIAGNOSIS — C220 Liver cell carcinoma: Secondary | ICD-10-CM

## 2015-06-29 ENCOUNTER — Ambulatory Visit
Admission: RE | Admit: 2015-06-29 | Discharge: 2015-06-29 | Disposition: A | Discharge status: Home / Self Care | Payer: BLUE CROSS/BLUE SHIELD | Source: Ambulatory Visit | Attending: Gastroenterology | Admitting: Gastroenterology

## 2015-06-29 DIAGNOSIS — C220 Liver cell carcinoma: Secondary | ICD-10-CM

## 2015-07-26 ENCOUNTER — Encounter: Payer: Self-pay | Admitting: Oncology

## 2015-07-30 ENCOUNTER — Telehealth: Payer: Self-pay | Admitting: Oncology

## 2015-07-30 NOTE — Telephone Encounter (Signed)
Returned called to patient today re 6/8 new patient appointment. Per patient he received confirmation letter in the mail re 6/8 appointment, he will come and needs physical location. Confirmed with patient date/time/location and gave valet instructions.

## 2015-08-12 ENCOUNTER — Ambulatory Visit (HOSPITAL_BASED_OUTPATIENT_CLINIC_OR_DEPARTMENT_OTHER): Payer: BLUE CROSS/BLUE SHIELD | Admitting: Oncology

## 2015-08-12 ENCOUNTER — Telehealth: Payer: Self-pay | Admitting: Oncology

## 2015-08-12 VITALS — BP 124/64 | HR 60 | Temp 98.0°F | Resp 18 | Ht 76.0 in | Wt 240.9 lb

## 2015-08-12 DIAGNOSIS — D509 Iron deficiency anemia, unspecified: Secondary | ICD-10-CM | POA: Diagnosis not present

## 2015-08-12 DIAGNOSIS — D72819 Decreased white blood cell count, unspecified: Secondary | ICD-10-CM | POA: Diagnosis not present

## 2015-08-12 DIAGNOSIS — D696 Thrombocytopenia, unspecified: Secondary | ICD-10-CM

## 2015-08-12 NOTE — Consult Note (Signed)
Reason for Referral: Pancytopenia.  HPI: 65 year old gentleman currently of [place] where he has been living for the last 30 years or so. He did gentleman with the history of autoimmune hepatitis dating back to 2009. He also has history of inflammatory bowel disease and celiac disease. He was initially treated with prednisone and subsequently needed Imuran to control his autoimmune hepatitis. He subsequently developed cirrhosis of the liver as well as portal hypertension and splenomegaly. He was noted to have progressive cytopenias in the last 2 years. He is a CBC in November 2015 showed a white cell count of 2.3 and a hemoglobin of 12.6. His platelets 427 and 2011. His last CBC in May 2017 showed a white cell count 1.5, hemoglobin of 10.3 and a platelet count of 82. His differential was normal. His MCV was 87. His iron levels at that time showed serum iron of 51 and saturation of 14%. In January 2017 his torso, was 1.5 and a platelet count of 78. In February 2016 his white cell count was 2.4, hemoglobin 13 and platelet 86. Clinically, he does report symptoms of fatigue and tiredness. Otherwise no constitutional symptoms. He denied any fevers or chills or weight loss. He continues to work full time as well as exercise regularly. He denied any bleeding complication. He denied any recurrent infections.  He does not report any headaches, blurry vision, syncope or seizures. He does not report any fevers, chills or sweats. He does not report any cough, wheezing or hemoptysis. Does not report any nausea, vomiting, abdominal pain, hematochezia or melena. He does not report any frequency, urgency or hesitancy. He does not report any skeletal complaints of arthralgias myalgias. Remaining review of systems unremarkable.    Past Medical History  Diagnosis Date  . Hyperlipidemia   . Gluten intolerance   . GERD (gastroesophageal reflux disease)   . Autoimmune hepatitis ([place]) dx 2005    secondary to gluten  allergy  . Pneumonia   . Arthritis   . History of nuclear stress test     Myoview 1/17: EF 61%, inferior and inferolateral ischemia, low risk  :  Past Surgical History  Procedure Laterality Date  . Spine surgery      x 4, 9 disc fusion  . Cholecystectomy    . Esophagogastroduodenoscopy N/A 11/29/2012    Procedure: ESOPHAGOGASTRODUODENOSCOPY (EGD);  Surgeon: Beryle Beams, MD;  Location: [facility];  Service: Endoscopy;  Laterality: N/A;  . Esophageal banding N/A 11/29/2012    Procedure: ESOPHAGEAL BANDING;  Surgeon: Beryle Beams, MD;  [facility];  Service: Endoscopy;  Laterality: N/A;  . Esophagogastroduodenoscopy N/A 01/24/2013    Procedure: ESOPHAGOGASTRODUODENOSCOPY (EGD);  Surgeon: Beryle Beams, MD;  Location: [facility];  Service: Endoscopy;  Laterality: N/A;  . Esophageal banding N/A 01/24/2013    Procedure: ESOPHAGEAL BANDING;  Surgeon: Beryle Beams, MD;  Location: [facility];  Service: Endoscopy;  Laterality: N/A;  . Back surgery    . Esophagogastroduodenoscopy N/A 02/21/2013    Procedure: ESOPHAGOGASTRODUODENOSCOPY (EGD);  Surgeon: Beryle Beams, MD;  Location: [facility];  Service: Endoscopy;  Laterality: N/A;  . Esophageal banding N/A 02/21/2013    Procedure: ESOPHAGEAL BANDING;  Surgeon: Beryle Beams, MD;  [facility];  Service: Endoscopy;  Laterality: N/A;  . Esophagogastroduodenoscopy (egd) with propofol N/A 03/20/2014    Procedure: ESOPHAGOGASTRODUODENOSCOPY (EGD) WITH PROPOFOL;  Surgeon: Beryle Beams, MD;  [facility]: [facility];  Service: Endoscopy;  Laterality: N/A;  . Esophageal banding N/A 03/20/2014  Procedure: ESOPHAGEAL BANDING;  Surgeon: Beryle Beams, MD;  Location: [facility];  Service: Endoscopy;  Laterality: N/A;  :   Current outpatient prescriptions:  .  alfuzosin (UROXATRAL) 10 MG 24 hr tablet, Take 10 mg by mouth daily., Disp: , Rfl:  .  aspirin 81 MG tablet, Take 81 mg by mouth daily., Disp: , Rfl:  .   atorvastatin (LIPITOR) 20 MG tablet, Take 20 mg by mouth daily., Disp: , Rfl:  .  azaTHIOprine (IMURAN) 50 MG tablet, Take 100 mg by mouth daily with breakfast. , Disp: , Rfl:  .  Calcium Carb-Cholecalciferol (CALCIUM 1000 + D) 1000-800 MG-UNIT TABS, Take 1 tablet by mouth daily., Disp: , Rfl:  .  esomeprazole (NEXIUM) 40 MG capsule, Take 40 mg by mouth daily before breakfast., Disp: , Rfl:  .  fish oil-omega-3 fatty acids 1000 MG capsule, Take 2 g by mouth daily., Disp: , Rfl:  .  nadolol (CORGARD) 40 MG tablet, Take 40 mg by mouth daily with breakfast. , Disp: , Rfl:  .  tadalafil (CIALIS) 5 MG tablet, Take 5 mg by mouth daily as needed for erectile dysfunction., Disp: , Rfl:  .  XIIDRA 5 % SOLN, Place 1 drop into both eyes daily., Disp: , Rfl: :  Allergies  Allergen Reactions  . Latex     Burns skin off  . Shellfish Allergy Anaphylaxis and Swelling    Legs swell and throat puffed out  . Gluten     Upset stomach   . Sulfa Antibiotics     Pass out  :  Family History  Problem Relation Age of Onset  . Osteoporosis Mother   . Heart attack Father   . Heart disease Father   . Alzheimer's disease Father   . Stroke Father   :  Social History   Social History  . Marital Status: Married    Spouse Name: N/A  . Number of Children: N/A  . Years of Education: N/A   Occupational History  . Not on file.   Social History Main Topics  . Smoking status: Never Smoker   . Smokeless tobacco: Never Used  . Alcohol Use: Yes     Comment: wine weekly-3 glasses  . Drug Use: No  . Sexual Activity: Not on file   Other Topics Concern  . Not on file   Social History Narrative  :  Pertinent items are noted in HPI.  Exam: Blood pressure 124/64, pulse 60, temperature 98 F (36.7 C), temperature source Oral, resp. rate 18, height 6' 4"  (1.93 m), weight 240 lb 14.4 oz (109.272 kg), SpO2 100 %. General appearance: alert and cooperativeAppeared without distress. Head: Normocephalic, without  obvious abnormality Throat: lips, mucosa, and tongue normal; teeth and gums normal oral ulcers or lesions. Neck: no adenopathy Back: negative, symmetric, no curvature. ROM normal. No CVA tenderness. Resp: clear to auscultation bilaterally no rhonchi, wheezes or dullness to percussion. Chest wall: no tenderness Cardio: regular rate and rhythm, S1, S2 normal, no murmur, click, rub or gallop GI: soft, non-tender; bowel sounds normal; no masses,  no organomegaly Extremities: extremities normal, atraumatic, no cyanosis or edema Pulses: 2+ and symmetric Skin: Skin color, texture, turgor normal. No rashes or lesions Lymph nodes: Cervical, supraclavicular, and axillary nodes normal.   CBC    Component Value Date/Time   WBC 2.3* 01/28/2014 0936   WBC 4.0 05/07/2009 0510   RBC 4.13* 01/28/2014 0936   RBC 3.54* 05/07/2009 0510   HGB 12.6* 01/28/2014 7915  HGB 12.6* 05/07/2009 0510   HCT 39.1* 01/28/2014 0936   HCT 36.2* 05/07/2009 0510   PLT 172 05/07/2009 0510   MCV 94.6 01/28/2014 0936   MCV 102.1* 05/07/2009 0510   MCH 30.6 01/28/2014 0936   MCHC 32.4 01/28/2014 0936   MCHC 34.9 05/07/2009 0510   RDW 16.6* 05/07/2009 0510   LYMPHSABS 0.9 05/07/2009 0510   MONOABS 0.4 05/07/2009 0510   EOSABS 0.1 05/07/2009 0510   BASOSABS 0.0 05/07/2009 0510   EXAM: ABDOMEN ULTRASOUND COMPLETE  COMPARISON: Abdominal ultrasound of April 07, 2014  FINDINGS: Gallbladder: The gallbladder is surgically absent  Common bile duct: Diameter: 4.9 mm  Liver: The hepatic echotexture is heterogeneous. The surface contour is irregular. There is no intrahepatic ductal dilation. No discrete hepatic mass is observed.  IVC: Bowel gas limits evaluation of the inferior vena cava.  Pancreas: Bowel gas limits evaluation of the pancreas.  Spleen: Splenomegaly with calculated volume of 888 cc.  Right Kidney: Length: 12.6 cm. Echogenicity within normal limits. No mass or hydronephrosis  visualized.  Left Kidney: Length: 13 cm. Visualization of the left kidney is limited by bowel gas.  Abdominal aorta: The abdominal aorta is obscured by bowel gas.  Other findings: No ascites is observed.  IMPRESSION: The study is degraded overall by bowel gas. There is heterogeneous hepatic echotexture consistent with cirrhosis. No discrete mass is observed. There is persistent splenomegaly    Assessment and Plan:  65 year old gentleman with the following issues:  1. Thrombocytopenia and leukocytopenia: The differential diagnosis includes splenic sequestration because of splenomegaly related to portal hypertension. 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 This is likely causing sequestration and decline in his white cell count and platelets. Other causes to be considered would be medication effect such as Imuran although he is been on this medication since at least 2009.   Other considerations include autoimmune etiology causing ITP and autoimmune leukocytopenia. There is less likely at this time.  Hematological disorder such as myelodysplasia or leukemia are extremely unlikely.  A management standpoint I have recommended continued observation given the fact that he is completely asymptomatic and the risk of bleeding and infection is very low. I see no need for any growth factor support or transfusion.  2. Anemia: He has an element of iron deficiency. His celiac disease and poor absorption of oral iron. He was intolerant to oral iron as well with GI toxicities. I will repeat iron studies in the future and recommend intravenous iron if he continues to persistent iron deficiency. Other causes of his anemia include GI blood losses, sequestration and other vitamin deficiencies. We will continue to monitor his counts closely.  3. Follow-up: With the next 4 months to repeat his counts.

## 2015-08-12 NOTE — Telephone Encounter (Signed)
Gave pt apt & avs

## 2015-08-12 NOTE — Progress Notes (Signed)
Please see consult note.  

## 2015-09-17 DIAGNOSIS — C44622 Squamous cell carcinoma of skin of right upper limb, including shoulder: Secondary | ICD-10-CM | POA: Diagnosis not present

## 2015-09-17 DIAGNOSIS — L570 Actinic keratosis: Secondary | ICD-10-CM | POA: Diagnosis not present

## 2015-11-19 DIAGNOSIS — Z85828 Personal history of other malignant neoplasm of skin: Secondary | ICD-10-CM | POA: Diagnosis not present

## 2015-11-19 DIAGNOSIS — L812 Freckles: Secondary | ICD-10-CM | POA: Diagnosis not present

## 2015-11-19 DIAGNOSIS — L820 Inflamed seborrheic keratosis: Secondary | ICD-10-CM | POA: Diagnosis not present

## 2015-11-19 DIAGNOSIS — C44622 Squamous cell carcinoma of skin of right upper limb, including shoulder: Secondary | ICD-10-CM | POA: Diagnosis not present

## 2015-11-19 DIAGNOSIS — L821 Other seborrheic keratosis: Secondary | ICD-10-CM | POA: Diagnosis not present

## 2015-11-19 DIAGNOSIS — B078 Other viral warts: Secondary | ICD-10-CM | POA: Diagnosis not present

## 2015-11-25 DIAGNOSIS — M5412 Radiculopathy, cervical region: Secondary | ICD-10-CM | POA: Diagnosis not present

## 2015-12-02 ENCOUNTER — Other Ambulatory Visit (HOSPITAL_BASED_OUTPATIENT_CLINIC_OR_DEPARTMENT_OTHER): Payer: BLUE CROSS/BLUE SHIELD

## 2015-12-02 ENCOUNTER — Ambulatory Visit (HOSPITAL_BASED_OUTPATIENT_CLINIC_OR_DEPARTMENT_OTHER): Payer: BLUE CROSS/BLUE SHIELD | Admitting: Oncology

## 2015-12-02 ENCOUNTER — Telehealth: Payer: Self-pay | Admitting: Oncology

## 2015-12-02 VITALS — BP 106/67 | HR 53 | Temp 98.1°F | Resp 18 | Ht 76.0 in | Wt 297.4 lb

## 2015-12-02 DIAGNOSIS — R161 Splenomegaly, not elsewhere classified: Secondary | ICD-10-CM

## 2015-12-02 DIAGNOSIS — K7460 Unspecified cirrhosis of liver: Secondary | ICD-10-CM | POA: Diagnosis not present

## 2015-12-02 DIAGNOSIS — K754 Autoimmune hepatitis: Secondary | ICD-10-CM

## 2015-12-02 DIAGNOSIS — K769 Liver disease, unspecified: Secondary | ICD-10-CM | POA: Diagnosis not present

## 2015-12-02 DIAGNOSIS — D509 Iron deficiency anemia, unspecified: Secondary | ICD-10-CM

## 2015-12-02 DIAGNOSIS — D61818 Other pancytopenia: Secondary | ICD-10-CM

## 2015-12-02 LAB — IRON AND TIBC
%SAT: 5 % — ABNORMAL LOW (ref 20–55)
Iron: 19 ug/dL — ABNORMAL LOW (ref 42–163)
TIBC: 366 ug/dL (ref 202–409)
UIBC: 347 ug/dL (ref 117–376)

## 2015-12-02 LAB — CBC WITH DIFFERENTIAL/PLATELET
BASO%: 0.7 % (ref 0.0–2.0)
Basophils Absolute: 0 10*3/uL (ref 0.0–0.1)
EOS%: 4.6 % (ref 0.0–7.0)
Eosinophils Absolute: 0.1 10*3/uL (ref 0.0–0.5)
HCT: 30.7 % — ABNORMAL LOW (ref 38.4–49.9)
HGB: 9.7 g/dL — ABNORMAL LOW (ref 13.0–17.1)
LYMPH%: 25.7 % (ref 14.0–49.0)
MCH: 27.2 pg (ref 27.2–33.4)
MCHC: 31.6 g/dL — ABNORMAL LOW (ref 32.0–36.0)
MCV: 86.2 fL (ref 79.3–98.0)
MONO#: 0.2 10*3/uL (ref 0.1–0.9)
MONO%: 10.5 % (ref 0.0–14.0)
NEUT#: 0.9 10*3/uL — ABNORMAL LOW (ref 1.5–6.5)
NEUT%: 58.5 % (ref 39.0–75.0)
Platelets: 72 10*3/uL — ABNORMAL LOW (ref 140–400)
RBC: 3.56 10*6/uL — ABNORMAL LOW (ref 4.20–5.82)
RDW: 18.6 % — ABNORMAL HIGH (ref 11.0–14.6)
WBC: 1.5 10*3/uL — ABNORMAL LOW (ref 4.0–10.3)
lymph#: 0.4 10*3/uL — ABNORMAL LOW (ref 0.9–3.3)
nRBC: 0 % (ref 0–0)

## 2015-12-02 LAB — FERRITIN: Ferritin: 12 ng/ml — ABNORMAL LOW (ref 22–316)

## 2015-12-02 NOTE — Telephone Encounter (Signed)
Avs report and appointment schedule given to patient, per 12/02/15 los.

## 2015-12-02 NOTE — Progress Notes (Signed)
Hematology and Oncology Follow Up Visit  BRALYNN DONADO [id number] 1950-08-11 65 y.o. 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 These findings documented since 2011.  Current therapy: Observation and surveillance.  Interim History: Mr. Fildes presents today for a follow-up visit. He is a pleasant gentleman with autoimmune hepatitis and cirrhosis of the liver. He has developed splenomegaly and sequestration of his platelets and white cells. Since the last visit he reports no major changes in his health. He continues to work full time without any decline. He does report decrease in his energy but no hospitalization or illnesses. He denied any bleeding, bruising or recent infections. He denied any ascites accumulation or major changes in his performance status.  He does not report any headaches, blurry vision, syncope or seizures. He does not report any fevers, chills or sweats. He does not report any cough, wheezing or hemoptysis. Does not report any nausea, vomiting, abdominal pain, hematochezia or melena. He does not report any frequency, urgency or hesitancy. He does not report any skeletal complaints of arthralgias myalgias. Remaining review of systems unremarkable.    Medications: I have reviewed the patient's current medications.  Current Outpatient Prescriptions  Medication Sig Dispense Refill  . alfuzosin (UROXATRAL) 10 MG 24 hr tablet Take 10 mg by mouth daily.    Marland Kitchen aspirin 81 MG tablet Take 81 mg by mouth daily.    Marland Kitchen atorvastatin (LIPITOR) 20 MG tablet Take 20 mg by mouth daily.    Marland Kitchen azaTHIOprine (IMURAN) 50 MG tablet Take 100 mg by mouth daily with breakfast.     . Calcium Carb-Cholecalciferol (CALCIUM 1000 + D) 1000-800 MG-UNIT TABS Take 1 tablet by mouth daily.    Marland Kitchen esomeprazole (NEXIUM) 40 MG capsule Take 40 mg by mouth daily  before breakfast.    . fish oil-omega-3 fatty acids 1000 MG capsule Take 2 g by mouth daily.    . nadolol (CORGARD) 40 MG tablet Take 40 mg by mouth daily with breakfast.     . tadalafil (CIALIS) 5 MG tablet Take 5 mg by mouth daily as needed for erectile dysfunction.     No current facility-administered medications for this visit.      Allergies:  Allergies  Allergen Reactions  . Latex     Burns skin off  . Shellfish Allergy Anaphylaxis and Swelling    Legs swell and throat puffed out  . Gluten     Upset stomach   . Sulfa Antibiotics     Pass out    Past Medical History, Surgical history, Social history, and Family History were reviewed and updated.   Physical Exam: Blood pressure 106/67, pulse (!) 53, temperature 98.1 F (36.7 C), temperature source Oral, resp. rate 18, height 6' 4"  (1.93 m), weight 297 lb 6.4 oz (134.9 kg), SpO2 99 %. ECOG: 1 General appearance: alert and cooperative Head: Normocephalic, without obvious abnormality Neck: no adenopathy Lymph nodes: Cervical, supraclavicular, and axillary nodes normal. Heart:regular rate and rhythm, S1, S2 normal, no murmur, click, rub or gallop Lung:chest clear, no wheezing, rales, normal symmetric air entry Abdomin: soft, non-tender, without masses or organomegaly EXT:no erythema, induration, or nodules   Lab Results: Lab Results  Component Value Date   WBC 1.5 (L) 12/02/2015   HGB 9.7 (L) 12/02/2015   HCT 30.7 (L) 12/02/2015   MCV 86.2 12/02/2015   PLT 72 (L) 12/02/2015     Chemistry  Component Value Date/Time   NA 138 03/11/2015 1521   K 3.8 03/11/2015 1521   CL 103 03/11/2015 1521   CO2 26 03/11/2015 1521   BUN 10 03/11/2015 1521   CREATININE 0.93 03/11/2015 1521      Component Value Date/Time   CALCIUM 9.1 03/11/2015 1521   ALKPHOS 62 01/28/2014 0931   AST 45 (H) 01/28/2014 0931   ALT 20 01/28/2014 0931   BILITOT 1.0 01/28/2014 0931       Impression and Plan:  66 year old gentleman with  the following issues:  1. Thrombocytopenia and leukocytopenia: The differential diagnosis includes splenic sequestration because of splenomegaly related to portal hypertension. 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 This is likely causing sequestration and decline in his white cell count and platelets. Other causes to be considered would be medication effect such as Imuran although he is been on this medication since at least 2009.   His CBC today and showed no major changes since the last visit. He has no bleeding or other complications noted. I recommended continued surveillance and repeat CBC in 6 months.  2. Anemia: Also related to his liver disease and may be chronic blood losses. I'm repeating his iron studies and we will consider intravenous iron if his oral iron is insufficient to replace his iron needs.  3. Follow-up: Will be in 6 months.   [facility], MD 9/28/20178:29 AM

## 2015-12-20 DIAGNOSIS — M5412 Radiculopathy, cervical region: Secondary | ICD-10-CM | POA: Diagnosis not present

## 2016-02-09 DIAGNOSIS — K641 Second degree hemorrhoids: Secondary | ICD-10-CM | POA: Diagnosis not present

## 2016-02-09 DIAGNOSIS — K644 Residual hemorrhoidal skin tags: Secondary | ICD-10-CM | POA: Diagnosis not present

## 2016-03-01 DIAGNOSIS — N411 Chronic prostatitis: Secondary | ICD-10-CM | POA: Diagnosis not present

## 2016-03-01 DIAGNOSIS — N5082 Scrotal pain: Secondary | ICD-10-CM | POA: Diagnosis not present

## 2016-03-28 DIAGNOSIS — N411 Chronic prostatitis: Secondary | ICD-10-CM | POA: Diagnosis not present

## 2016-03-28 DIAGNOSIS — N401 Enlarged prostate with lower urinary tract symptoms: Secondary | ICD-10-CM | POA: Diagnosis not present

## 2016-03-28 DIAGNOSIS — N529 Male erectile dysfunction, unspecified: Secondary | ICD-10-CM | POA: Diagnosis not present

## 2016-03-28 DIAGNOSIS — N138 Other obstructive and reflux uropathy: Secondary | ICD-10-CM | POA: Diagnosis not present

## 2016-04-03 ENCOUNTER — Encounter: Payer: Self-pay | Admitting: Oncology

## 2016-04-04 ENCOUNTER — Telehealth: Payer: Self-pay | Admitting: Interventional Cardiology

## 2016-04-04 DIAGNOSIS — I2510 Atherosclerotic heart disease of native coronary artery without angina pectoris: Secondary | ICD-10-CM

## 2016-04-04 DIAGNOSIS — E7800 Pure hypercholesterolemia, unspecified: Secondary | ICD-10-CM

## 2016-04-04 NOTE — Telephone Encounter (Signed)
BMET needed for CT scan.  Order placed.

## 2016-04-12 ENCOUNTER — Other Ambulatory Visit: Payer: BLUE CROSS/BLUE SHIELD | Admitting: *Deleted

## 2016-04-12 DIAGNOSIS — E7800 Pure hypercholesterolemia, unspecified: Secondary | ICD-10-CM | POA: Diagnosis not present

## 2016-04-12 LAB — BASIC METABOLIC PANEL
BUN/Creatinine Ratio: 11 (ref 10–24)
BUN: 9 mg/dL (ref 8–27)
CO2: 24 mmol/L (ref 18–29)
Calcium: 8.4 mg/dL — ABNORMAL LOW (ref 8.6–10.2)
Chloride: 104 mmol/L (ref 96–106)
Creatinine, Ser: 0.84 mg/dL (ref 0.76–1.27)
GFR calc Af Amer: 108 mL/min/{1.73_m2} (ref >59)
GFR calc non Af Amer: 93 mL/min/{1.73_m2} (ref >59)
Glucose: 90 mg/dL (ref 65–99)
Potassium: 4.7 mmol/L (ref 3.5–5.2)
Sodium: 138 mmol/L (ref 134–144)

## 2016-04-17 ENCOUNTER — Ambulatory Visit (INDEPENDENT_AMBULATORY_CARE_PROVIDER_SITE_OTHER)
Admission: RE | Admit: 2016-04-17 | Discharge: 2016-04-17 | Disposition: A | Discharge status: Home / Self Care | Payer: BLUE CROSS/BLUE SHIELD | Source: Ambulatory Visit | Attending: Interventional Cardiology | Admitting: Interventional Cardiology

## 2016-04-17 DIAGNOSIS — I712 Thoracic aortic aneurysm, without rupture, unspecified: Secondary | ICD-10-CM

## 2016-04-17 MED ORDER — IOPAMIDOL (ISOVUE-370) INJECTION 76%
100.0000 mL | Freq: Once | INTRAVENOUS | Status: AC | PRN
  Administered 2016-04-17: 100 mL via INTRAVENOUS

## 2016-05-04 DIAGNOSIS — N411 Chronic prostatitis: Secondary | ICD-10-CM | POA: Diagnosis not present

## 2016-05-09 DIAGNOSIS — Z0000 Encounter for general adult medical examination without abnormal findings: Secondary | ICD-10-CM | POA: Diagnosis not present

## 2016-05-09 DIAGNOSIS — E7800 Pure hypercholesterolemia, unspecified: Secondary | ICD-10-CM | POA: Diagnosis not present

## 2016-05-09 DIAGNOSIS — Z1389 Encounter for screening for other disorder: Secondary | ICD-10-CM | POA: Diagnosis not present

## 2016-05-25 DIAGNOSIS — M5412 Radiculopathy, cervical region: Secondary | ICD-10-CM | POA: Diagnosis not present

## 2016-05-31 ENCOUNTER — Ambulatory Visit (HOSPITAL_BASED_OUTPATIENT_CLINIC_OR_DEPARTMENT_OTHER): Payer: BLUE CROSS/BLUE SHIELD | Admitting: Oncology

## 2016-05-31 ENCOUNTER — Other Ambulatory Visit (HOSPITAL_BASED_OUTPATIENT_CLINIC_OR_DEPARTMENT_OTHER): Payer: BLUE CROSS/BLUE SHIELD

## 2016-05-31 ENCOUNTER — Telehealth: Payer: Self-pay | Admitting: Oncology

## 2016-05-31 VITALS — BP 122/66 | HR 59 | Temp 98.3°F | Resp 18 | Ht 76.0 in | Wt 281.5 lb

## 2016-05-31 DIAGNOSIS — D72819 Decreased white blood cell count, unspecified: Secondary | ICD-10-CM

## 2016-05-31 DIAGNOSIS — D696 Thrombocytopenia, unspecified: Secondary | ICD-10-CM

## 2016-05-31 DIAGNOSIS — R161 Splenomegaly, not elsewhere classified: Secondary | ICD-10-CM | POA: Diagnosis not present

## 2016-05-31 DIAGNOSIS — K766 Portal hypertension: Secondary | ICD-10-CM

## 2016-05-31 DIAGNOSIS — K7460 Unspecified cirrhosis of liver: Secondary | ICD-10-CM

## 2016-05-31 DIAGNOSIS — D649 Anemia, unspecified: Secondary | ICD-10-CM

## 2016-05-31 DIAGNOSIS — D61818 Other pancytopenia: Secondary | ICD-10-CM

## 2016-05-31 LAB — CBC WITH DIFFERENTIAL/PLATELET
BASO%: 1 % (ref 0.0–2.0)
Basophils Absolute: 0 10*3/uL (ref 0.0–0.1)
EOS%: 4.5 % (ref 0.0–7.0)
Eosinophils Absolute: 0.1 10*3/uL (ref 0.0–0.5)
HCT: 30.2 % — ABNORMAL LOW (ref 38.4–49.9)
HGB: 9.8 g/dL — ABNORMAL LOW (ref 13.0–17.1)
LYMPH%: 21.2 % (ref 14.0–49.0)
MCH: 28 pg (ref 27.2–33.4)
MCHC: 32.4 g/dL (ref 32.0–36.0)
MCV: 86.4 fL (ref 79.3–98.0)
MONO#: 0.2 10*3/uL (ref 0.1–0.9)
MONO%: 13.7 % (ref 0.0–14.0)
NEUT#: 1 10*3/uL — ABNORMAL LOW (ref 1.5–6.5)
NEUT%: 59.6 % (ref 39.0–75.0)
Platelets: 91 10*3/uL — ABNORMAL LOW (ref 140–400)
RBC: 3.5 10*6/uL — ABNORMAL LOW (ref 4.20–5.82)
RDW: 20.6 % — ABNORMAL HIGH (ref 11.0–14.6)
WBC: 1.7 10*3/uL — ABNORMAL LOW (ref 4.0–10.3)
lymph#: 0.4 10*3/uL — ABNORMAL LOW (ref 0.9–3.3)

## 2016-05-31 LAB — IRON AND TIBC
%SAT: 6 % — ABNORMAL LOW (ref 20–55)
Iron: 20 ug/dL — ABNORMAL LOW (ref 42–163)
TIBC: 351 ug/dL (ref 202–409)
UIBC: 331 ug/dL (ref 117–376)

## 2016-05-31 LAB — FERRITIN: Ferritin: 12 ng/ml — ABNORMAL LOW (ref 22–316)

## 2016-05-31 NOTE — Telephone Encounter (Signed)
Appointments scheduled per 05/31/16 los. Patient was given a copy of the AVS report and appointment schedule, per 05/31/16 los.

## 2016-05-31 NOTE — Progress Notes (Signed)
Hematology and Oncology Follow Up Visit  Corey Shannon [id number] [birth year] 66 y.o. 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 These findings documented since 2011. 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  Current therapy: Observation and surveillance.  Interim History: Mr. Corey Shannon presents today for a follow-up visit. Since the last visit, he reports doing reasonably well without any recent complaints. He underwent dental surgery without any postoperative bleeding or complications. He continues to work full time without any decline. He is planning to retire this fall. He does report decrease in his energy but no hospitalization or illnesses. He denied any bleeding, bruising or recent infections. He denied any ascites accumulation.  He is scheduled to have neck fusion surgery in June 2018.  He does not report any headaches, blurry vision, syncope or seizures. He does not report any fevers, chills or sweats. He does not report any cough, wheezing or hemoptysis. Does not report any nausea, vomiting, abdominal pain, hematochezia or melena. He does not report any frequency, urgency or hesitancy. He does not report any skeletal complaints of arthralgias myalgias. Remaining review of systems unremarkable.    Medications: I have reviewed the patient's current medications.  Current Outpatient Prescriptions  Medication Sig Dispense Refill  . alfuzosin (UROXATRAL) 10 MG 24 hr tablet TAKE 1 TABLET DAILY.    Marland Kitchen aspirin 81 MG tablet Take 81 mg by mouth daily.    Marland Kitchen atorvastatin (LIPITOR) 20 MG tablet Take 20 mg by mouth daily.    Marland Kitchen azaTHIOprine (IMURAN) 50 MG tablet Take 100 mg by mouth daily with breakfast.     . Calcium Carb-Cholecalciferol (CALCIUM 1000 + D) 1000-800 MG-UNIT TABS Take 1 tablet by mouth  daily.    Marland Kitchen esomeprazole (NEXIUM) 40 MG capsule Take 40 mg by mouth daily before breakfast.    . fish oil-omega-3 fatty acids 1000 MG capsule Take 2 g by mouth daily.    . nadolol (CORGARD) 40 MG tablet Take 40 mg by mouth daily with breakfast.     . tadalafil (CIALIS) 5 MG tablet Take 5 mg by mouth daily as needed for erectile dysfunction.     No current facility-administered medications for this visit.      Allergies:  Allergies  Allergen Reactions  . Latex     Burns skin off  . Shellfish Allergy Anaphylaxis and Swelling    Legs swell and throat puffed out  . Gluten     Upset stomach   . Sulfa Antibiotics     Pass out    Past Medical History, Surgical history, Social history, and Family History were reviewed and updated.   Physical Exam: Blood pressure 122/66, pulse (!) 59, temperature 98.3 F (36.8 C), temperature source Oral, resp. rate 18, height 6' 4"  (1.93 m), weight 281 lb 8 oz (127.7 kg), SpO2 100 %. ECOG: 1 General appearance: alert and cooperative appeared without distress. Head: Normocephalic, without obvious abnormality oral ulcers or lesions. Neck: no adenopathy Lymph nodes: Cervical, supraclavicular, and axillary nodes normal. Heart:regular rate and rhythm, S1, S2 normal, no murmur, click, rub or gallop Lung:chest clear, no wheezing, rales, normal symmetric air entry Abdomin: soft, non-tender, without masses or organomegaly no shifting dullness or ascites. EXT:no erythema, induration, or nodules   Lab Results: Lab Results  Component Value Date   WBC 1.7 (L) 05/31/2016   HGB 9.8 (L)  05/31/2016   HCT 30.2 (L) 05/31/2016   MCV 86.4 05/31/2016   PLT 91 (L) 05/31/2016     Chemistry      Component Value Date/Time   NA 138 04/12/2016 0743   K 4.7 04/12/2016 0743   CL 104 04/12/2016 0743   CO2 24 04/12/2016 0743   BUN 9 04/12/2016 0743   CREATININE 0.84 04/12/2016 0743   CREATININE 0.93 03/11/2015 1521      Component Value Date/Time   CALCIUM 8.4  (L) 04/12/2016 0743   ALKPHOS 62 01/28/2014 0931   AST 45 (H) 01/28/2014 0931   ALT 20 01/28/2014 0931   BILITOT 1.0 01/28/2014 0931       Impression and Plan:  66 year old gentleman with the following issues:  1. Thrombocytopenia and leukocytopenia: This is due to splenomegaly related to portal hypertension. 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   His CBC today showed improvement in his platelet count to 91,000 indicating no progression of his sequestration. His platelet count is adequate for hemostasis without any recurrent bleeding. He was able to withstand dental surgery without any complications. His absolute neutrophil count is 1000 and does not have any infections.  I do not recommend any further intervention for the scans at this time and we'll continue to monitor him periodically.  2. Anemia: Also related to his liver disease and may be chronic blood losses. No intervention is needed at this time.  3. Anticipated neck fusion surgery: His platelet count should be adequate for this procedure but it platelet counts is needed to be higher, I platelet transfusion with his operation can boost his platelet counts to have that procedure safely.  4. Follow-up: Will be in 6 months.    [facility], MD 3/28/20188:30 AM

## 2016-06-08 DIAGNOSIS — N401 Enlarged prostate with lower urinary tract symptoms: Secondary | ICD-10-CM | POA: Diagnosis not present

## 2016-06-08 DIAGNOSIS — N138 Other obstructive and reflux uropathy: Secondary | ICD-10-CM | POA: Diagnosis not present

## 2016-06-23 DIAGNOSIS — L570 Actinic keratosis: Secondary | ICD-10-CM | POA: Diagnosis not present

## 2016-06-23 DIAGNOSIS — Z85828 Personal history of other malignant neoplasm of skin: Secondary | ICD-10-CM | POA: Diagnosis not present

## 2016-06-23 DIAGNOSIS — K900 Celiac disease: Secondary | ICD-10-CM | POA: Diagnosis not present

## 2016-06-23 DIAGNOSIS — K754 Autoimmune hepatitis: Secondary | ICD-10-CM | POA: Diagnosis not present

## 2016-06-23 DIAGNOSIS — K5100 Ulcerative (chronic) pancolitis without complications: Secondary | ICD-10-CM | POA: Diagnosis not present

## 2016-06-23 DIAGNOSIS — L820 Inflamed seborrheic keratosis: Secondary | ICD-10-CM | POA: Diagnosis not present

## 2016-06-23 DIAGNOSIS — D0462 Carcinoma in situ of skin of left upper limb, including shoulder: Secondary | ICD-10-CM | POA: Diagnosis not present

## 2016-06-23 DIAGNOSIS — B078 Other viral warts: Secondary | ICD-10-CM | POA: Diagnosis not present

## 2016-06-23 DIAGNOSIS — L821 Other seborrheic keratosis: Secondary | ICD-10-CM | POA: Diagnosis not present

## 2016-08-03 DIAGNOSIS — G4700 Insomnia, unspecified: Secondary | ICD-10-CM | POA: Diagnosis not present

## 2016-08-03 DIAGNOSIS — I712 Thoracic aortic aneurysm, without rupture: Secondary | ICD-10-CM | POA: Diagnosis not present

## 2016-08-03 DIAGNOSIS — R0683 Snoring: Secondary | ICD-10-CM | POA: Diagnosis not present

## 2016-08-03 DIAGNOSIS — E65 Localized adiposity: Secondary | ICD-10-CM | POA: Diagnosis not present

## 2016-08-03 DIAGNOSIS — E669 Obesity, unspecified: Secondary | ICD-10-CM | POA: Diagnosis not present

## 2016-08-03 DIAGNOSIS — M5412 Radiculopathy, cervical region: Secondary | ICD-10-CM | POA: Diagnosis not present

## 2016-08-03 DIAGNOSIS — Z01818 Encounter for other preprocedural examination: Secondary | ICD-10-CM | POA: Diagnosis not present

## 2016-08-16 ENCOUNTER — Ambulatory Visit (HOSPITAL_BASED_OUTPATIENT_CLINIC_OR_DEPARTMENT_OTHER): Payer: BLUE CROSS/BLUE SHIELD | Admitting: Oncology

## 2016-08-16 VITALS — BP 105/59 | HR 56 | Temp 98.3°F | Resp 18 | Ht 76.0 in | Wt 284.8 lb

## 2016-08-16 DIAGNOSIS — I9589 Other hypotension: Secondary | ICD-10-CM

## 2016-08-16 DIAGNOSIS — D6959 Other secondary thrombocytopenia: Secondary | ICD-10-CM | POA: Diagnosis not present

## 2016-08-16 DIAGNOSIS — D696 Thrombocytopenia, unspecified: Secondary | ICD-10-CM

## 2016-08-16 DIAGNOSIS — D509 Iron deficiency anemia, unspecified: Secondary | ICD-10-CM

## 2016-08-16 DIAGNOSIS — K7469 Other cirrhosis of liver: Secondary | ICD-10-CM

## 2016-08-16 DIAGNOSIS — K754 Autoimmune hepatitis: Secondary | ICD-10-CM

## 2016-08-16 NOTE — Progress Notes (Signed)
Hematology and Oncology Follow Up Visit  Corey Shannon [id number] 02-Oct-1950 66 y.o. 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 These findings documented since 2011. 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  Current therapy: Observation and surveillance.  Interim History: Corey Shannon presents today for a follow-up visit. Since the last visit, he reports no recent complaints or changes in his health. He continues to work full time without any decline. He is planning to retire this fall. He does report decrease in his energy but no hospitalization or illnesses. He denied any bleeding, bruising or recent infections. He denied any ascites accumulation.  He is scheduled to have neck fusion surgery next week to be done at [facility]. He had a back operation in the past without complications.  He does not report any headaches, blurry vision, syncope or seizures. He does not report any fevers, chills or sweats. He does not report any cough, wheezing or hemoptysis. Does not report any nausea, vomiting, abdominal pain, hematochezia or melena. He does not report any frequency, urgency or hesitancy. He does not report any skeletal complaints of arthralgias myalgias. Remaining review of systems unremarkable.    Medications: I have reviewed the patient's current medications.  Current Outpatient Prescriptions  Medication Sig Dispense Refill  . alfuzosin (UROXATRAL) 10 MG 24 hr tablet TAKE 1 TABLET DAILY.    Marland Kitchen aspirin 81 MG tablet Take 81 mg by mouth daily.    Marland Kitchen atorvastatin (LIPITOR) 20 MG tablet Take 20 mg by mouth daily.    Marland Kitchen azaTHIOprine (IMURAN) 50 MG tablet Take 100 mg by mouth daily with breakfast.     . Calcium Carb-Cholecalciferol (CALCIUM 1000 + D) 1000-800 MG-UNIT TABS  Take 1 tablet by mouth daily.    Marland Kitchen esomeprazole (NEXIUM) 40 MG capsule Take 40 mg by mouth daily before breakfast.    . fish oil-omega-3 fatty acids 1000 MG capsule Take 2 g by mouth daily.    . nadolol (CORGARD) 40 MG tablet Take 40 mg by mouth daily with breakfast.     . tadalafil (CIALIS) 5 MG tablet Take 5 mg by mouth daily as needed for erectile dysfunction.     No current facility-administered medications for this visit.      Allergies:  Allergies  Allergen Reactions  . Latex     Burns skin off  . Shellfish Allergy Anaphylaxis and Swelling    Legs swell and throat puffed out  . Gluten     Upset stomach   . Sulfa Antibiotics     Pass out    Past Medical History, Surgical history, Social history, and Family History were reviewed and updated.   Physical Exam: Blood pressure (!) 105/59, pulse (!) 56, temperature 98.3 F (36.8 C), temperature source Oral, resp. rate 18, height 6' 4"  (1.93 m), weight 284 lb 12.8 oz (129.2 kg), SpO2 99 %. ECOG: 1 General appearance: Well-appearing gentleman without distress. Head: Normocephalic, without obvious abnormality oral ulcers or lesions. Neck: no adenopathy Lymph nodes: Cervical, supraclavicular, and axillary nodes normal. Heart:regular rate and rhythm, S1, S2 normal, no murmur, click, rub or gallop Lung:chest clear, no wheezing, rales, normal symmetric air entry Abdomin: soft, non-tender, without masses or organomegaly no rebound or guarding. EXT:no erythema, induration, or nodules   Lab Results: Lab Results  Component Value Date   WBC  1.7 (L) 05/31/2016   HGB 9.8 (L) 05/31/2016   HCT 30.2 (L) 05/31/2016   MCV 86.4 05/31/2016   PLT 91 (L) 05/31/2016     Chemistry      Component Value Date/Time   NA 138 04/12/2016 0743   K 4.7 04/12/2016 0743   CL 104 04/12/2016 0743   CO2 24 04/12/2016 0743   BUN 9 04/12/2016 0743   CREATININE 0.84 04/12/2016 0743   CREATININE 0.93 03/11/2015 1521      Component Value Date/Time    CALCIUM 8.4 (L) 04/12/2016 0743   ALKPHOS 62 01/28/2014 0931   AST 45 (H) 01/28/2014 0931   ALT 20 01/28/2014 0931   BILITOT 1.0 01/28/2014 0931       Impression and Plan:  66 year old gentleman with the following issues:  1. Thrombocytopenia and leukocytopenia: This is due to splenomegaly related to portal hypertension. 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   His CBC Obtained on 08/03/2016 at [facility] showed a platelet count of 65,000 with white cell count of 1.7 and a hemoglobin of 9.2.  He has no active bleeding but he scheduled to have neck fusion surgery in the near future.  2. Anemia: Related to iron deficiency and I have recommended a low iron replacements.  3. Anticipated neck fusion surgery: His platelet count should be adequate for this procedure. I would recommend having platelet available for transfusion on call to the operating room encases bleeding noted intraoperatively. If higher platelet count as desired for this operation by his neurosurgeon, a platelet transfusion just before the procedure will boost his platelet count adequately for a safe level close to 100,000.  4. Follow-up: Will be in September 2018.    [facility], MD 6/13/201810:17 AM

## 2016-08-23 DIAGNOSIS — Z4789 Encounter for other orthopedic aftercare: Secondary | ICD-10-CM | POA: Diagnosis not present

## 2016-08-23 DIAGNOSIS — R0689 Other abnormalities of breathing: Secondary | ICD-10-CM | POA: Diagnosis not present

## 2016-08-23 DIAGNOSIS — M5412 Radiculopathy, cervical region: Secondary | ICD-10-CM | POA: Diagnosis not present

## 2016-08-23 DIAGNOSIS — R079 Chest pain, unspecified: Secondary | ICD-10-CM | POA: Diagnosis not present

## 2016-08-23 DIAGNOSIS — D62 Acute posthemorrhagic anemia: Secondary | ICD-10-CM | POA: Diagnosis not present

## 2016-08-23 DIAGNOSIS — G9761 Postprocedural hematoma of a nervous system organ or structure following a nervous system procedure: Secondary | ICD-10-CM | POA: Diagnosis not present

## 2016-08-23 DIAGNOSIS — M96840 Postprocedural hematoma of a musculoskeletal structure following a musculoskeletal system procedure: Secondary | ICD-10-CM | POA: Diagnosis not present

## 2016-08-23 DIAGNOSIS — K900 Celiac disease: Secondary | ICD-10-CM | POA: Diagnosis not present

## 2016-08-23 DIAGNOSIS — D696 Thrombocytopenia, unspecified: Secondary | ICD-10-CM | POA: Diagnosis not present

## 2016-08-23 DIAGNOSIS — I8500 Esophageal varices without bleeding: Secondary | ICD-10-CM | POA: Diagnosis not present

## 2016-08-23 DIAGNOSIS — M479 Spondylosis, unspecified: Secondary | ICD-10-CM | POA: Diagnosis not present

## 2016-08-23 DIAGNOSIS — M4802 Spinal stenosis, cervical region: Secondary | ICD-10-CM | POA: Diagnosis not present

## 2016-08-23 DIAGNOSIS — K219 Gastro-esophageal reflux disease without esophagitis: Secondary | ICD-10-CM | POA: Diagnosis not present

## 2016-08-23 DIAGNOSIS — L7632 Postprocedural hematoma of skin and subcutaneous tissue following other procedure: Secondary | ICD-10-CM | POA: Diagnosis not present

## 2016-08-23 DIAGNOSIS — Z978 Presence of other specified devices: Secondary | ICD-10-CM | POA: Diagnosis not present

## 2016-08-23 DIAGNOSIS — E785 Hyperlipidemia, unspecified: Secondary | ICD-10-CM | POA: Diagnosis not present

## 2016-08-23 DIAGNOSIS — I868 Varicose veins of other specified sites: Secondary | ICD-10-CM | POA: Diagnosis not present

## 2016-08-23 DIAGNOSIS — R918 Other nonspecific abnormal finding of lung field: Secondary | ICD-10-CM | POA: Diagnosis not present

## 2016-08-23 DIAGNOSIS — D6959 Other secondary thrombocytopenia: Secondary | ICD-10-CM | POA: Diagnosis not present

## 2016-08-23 DIAGNOSIS — M542 Cervicalgia: Secondary | ICD-10-CM | POA: Diagnosis not present

## 2016-08-23 DIAGNOSIS — D591 Other autoimmune hemolytic anemias: Secondary | ICD-10-CM | POA: Diagnosis not present

## 2016-08-23 DIAGNOSIS — K754 Autoimmune hepatitis: Secondary | ICD-10-CM | POA: Diagnosis not present

## 2016-08-23 DIAGNOSIS — J90 Pleural effusion, not elsewhere classified: Secondary | ICD-10-CM | POA: Diagnosis not present

## 2016-08-23 DIAGNOSIS — K5190 Ulcerative colitis, unspecified, without complications: Secondary | ICD-10-CM | POA: Diagnosis not present

## 2016-08-23 DIAGNOSIS — Z981 Arthrodesis status: Secondary | ICD-10-CM | POA: Diagnosis not present

## 2016-08-23 DIAGNOSIS — Z9911 Dependence on respirator [ventilator] status: Secondary | ICD-10-CM | POA: Diagnosis not present

## 2016-08-23 DIAGNOSIS — M4712 Other spondylosis with myelopathy, cervical region: Secondary | ICD-10-CM | POA: Diagnosis not present

## 2016-08-23 DIAGNOSIS — N419 Inflammatory disease of prostate, unspecified: Secondary | ICD-10-CM | POA: Diagnosis not present

## 2016-08-23 DIAGNOSIS — N401 Enlarged prostate with lower urinary tract symptoms: Secondary | ICD-10-CM | POA: Diagnosis not present

## 2016-08-24 DIAGNOSIS — R918 Other nonspecific abnormal finding of lung field: Secondary | ICD-10-CM | POA: Diagnosis not present

## 2016-08-24 DIAGNOSIS — Z9911 Dependence on respirator [ventilator] status: Secondary | ICD-10-CM | POA: Diagnosis not present

## 2016-08-24 DIAGNOSIS — Z978 Presence of other specified devices: Secondary | ICD-10-CM | POA: Diagnosis not present

## 2016-08-24 DIAGNOSIS — M479 Spondylosis, unspecified: Secondary | ICD-10-CM | POA: Diagnosis not present

## 2016-08-24 DIAGNOSIS — J90 Pleural effusion, not elsewhere classified: Secondary | ICD-10-CM | POA: Diagnosis not present

## 2016-08-24 DIAGNOSIS — L7632 Postprocedural hematoma of skin and subcutaneous tissue following other procedure: Secondary | ICD-10-CM | POA: Diagnosis not present

## 2016-08-24 DIAGNOSIS — M542 Cervicalgia: Secondary | ICD-10-CM | POA: Diagnosis not present

## 2016-08-24 DIAGNOSIS — Z4789 Encounter for other orthopedic aftercare: Secondary | ICD-10-CM | POA: Diagnosis not present

## 2016-08-24 DIAGNOSIS — R0689 Other abnormalities of breathing: Secondary | ICD-10-CM | POA: Diagnosis not present

## 2016-08-24 DIAGNOSIS — Z981 Arthrodesis status: Secondary | ICD-10-CM | POA: Diagnosis not present

## 2016-08-24 DIAGNOSIS — M96840 Postprocedural hematoma of a musculoskeletal structure following a musculoskeletal system procedure: Secondary | ICD-10-CM | POA: Diagnosis not present

## 2016-08-25 DIAGNOSIS — D696 Thrombocytopenia, unspecified: Secondary | ICD-10-CM | POA: Diagnosis not present

## 2016-08-25 DIAGNOSIS — Z4789 Encounter for other orthopedic aftercare: Secondary | ICD-10-CM | POA: Diagnosis not present

## 2016-08-25 DIAGNOSIS — R079 Chest pain, unspecified: Secondary | ICD-10-CM | POA: Diagnosis not present

## 2016-08-25 DIAGNOSIS — Z981 Arthrodesis status: Secondary | ICD-10-CM | POA: Diagnosis not present

## 2016-08-25 DIAGNOSIS — M479 Spondylosis, unspecified: Secondary | ICD-10-CM | POA: Diagnosis not present

## 2016-08-26 DIAGNOSIS — D696 Thrombocytopenia, unspecified: Secondary | ICD-10-CM | POA: Diagnosis not present

## 2016-08-26 DIAGNOSIS — Z981 Arthrodesis status: Secondary | ICD-10-CM | POA: Diagnosis not present

## 2016-08-26 DIAGNOSIS — M479 Spondylosis, unspecified: Secondary | ICD-10-CM | POA: Diagnosis not present

## 2016-08-26 DIAGNOSIS — Z4789 Encounter for other orthopedic aftercare: Secondary | ICD-10-CM | POA: Diagnosis not present

## 2016-11-16 DIAGNOSIS — Z981 Arthrodesis status: Secondary | ICD-10-CM | POA: Diagnosis not present

## 2016-11-16 DIAGNOSIS — M542 Cervicalgia: Secondary | ICD-10-CM | POA: Diagnosis not present

## 2016-11-16 DIAGNOSIS — M50323 Other cervical disc degeneration at C6-C7 level: Secondary | ICD-10-CM | POA: Diagnosis not present

## 2016-11-23 DIAGNOSIS — R1312 Dysphagia, oropharyngeal phase: Secondary | ICD-10-CM | POA: Diagnosis not present

## 2016-11-23 DIAGNOSIS — Z9889 Other specified postprocedural states: Secondary | ICD-10-CM | POA: Diagnosis not present

## 2016-11-29 ENCOUNTER — Other Ambulatory Visit (HOSPITAL_BASED_OUTPATIENT_CLINIC_OR_DEPARTMENT_OTHER): Payer: BLUE CROSS/BLUE SHIELD

## 2016-11-29 ENCOUNTER — Ambulatory Visit (HOSPITAL_BASED_OUTPATIENT_CLINIC_OR_DEPARTMENT_OTHER): Payer: BLUE CROSS/BLUE SHIELD | Admitting: Oncology

## 2016-11-29 ENCOUNTER — Telehealth: Payer: Self-pay | Admitting: Oncology

## 2016-11-29 VITALS — BP 107/62 | HR 66 | Temp 98.1°F | Resp 18 | Ht 76.0 in | Wt 266.6 lb

## 2016-11-29 DIAGNOSIS — D649 Anemia, unspecified: Secondary | ICD-10-CM

## 2016-11-29 DIAGNOSIS — D696 Thrombocytopenia, unspecified: Secondary | ICD-10-CM

## 2016-11-29 DIAGNOSIS — K7460 Unspecified cirrhosis of liver: Secondary | ICD-10-CM | POA: Diagnosis not present

## 2016-11-29 DIAGNOSIS — D509 Iron deficiency anemia, unspecified: Secondary | ICD-10-CM

## 2016-11-29 DIAGNOSIS — D72819 Decreased white blood cell count, unspecified: Secondary | ICD-10-CM | POA: Diagnosis not present

## 2016-11-29 LAB — CBC WITH DIFFERENTIAL/PLATELET
BASO%: 0.6 % (ref 0.0–2.0)
Basophils Absolute: 0 10*3/uL (ref 0.0–0.1)
EOS%: 4.8 % (ref 0.0–7.0)
Eosinophils Absolute: 0.1 10*3/uL (ref 0.0–0.5)
HCT: 26.7 % — ABNORMAL LOW (ref 38.4–49.9)
HGB: 8.4 g/dL — ABNORMAL LOW (ref 13.0–17.1)
LYMPH%: 24.3 % (ref 14.0–49.0)
MCH: 25.3 pg — ABNORMAL LOW (ref 27.2–33.4)
MCHC: 31.5 g/dL — ABNORMAL LOW (ref 32.0–36.0)
MCV: 80.4 fL (ref 79.3–98.0)
MONO#: 0.1 10*3/uL (ref 0.1–0.9)
MONO%: 7.9 % (ref 0.0–14.0)
NEUT#: 0.9 10*3/uL — ABNORMAL LOW (ref 1.5–6.5)
NEUT%: 62.4 % (ref 39.0–75.0)
Platelets: 76 10*3/uL — ABNORMAL LOW (ref 140–400)
RBC: 3.32 10*6/uL — ABNORMAL LOW (ref 4.20–5.82)
RDW: 20.9 % — ABNORMAL HIGH (ref 11.0–14.6)
WBC: 1.4 10*3/uL — ABNORMAL LOW (ref 4.0–10.3)
lymph#: 0.3 10*3/uL — ABNORMAL LOW (ref 0.9–3.3)

## 2016-11-29 NOTE — Progress Notes (Signed)
Hematology and Oncology Follow Up Visit  Corey Shannon [id number] Apr 24, 1950 66 y.o. 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 These findings documented since 2011. 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  Current therapy: Observation and surveillance.  Interim History: Corey Shannon presents today for a follow-up visit. Since the last visit, he underwent a neck operation in June 2018 at [facility] and tolerated the surgery well. He did develop postoperative bleeding after an arterial injury. He did require packed red cell transfusion as well as platelet transfusion which resulted in a hematoma. He reports that his hematoma has resolved and she is back to normal at this time.  He had any excessive fatigue, tiredness or shortness of breath. He denies any neck pain or stiffness. He denied any hematochezia or melena.  He does not report any headaches, blurry vision, syncope or seizures. He does not report any fevers, chills or sweats. He does not report any cough, wheezing or hemoptysis. Does not report any nausea, vomiting, abdominal pain, hematochezia or melena. He does not report any frequency, urgency or hesitancy. He does not report any skeletal complaints of arthralgias myalgias. Remaining review of systems unremarkable.    Medications: I have reviewed the patient's current medications.  Current Outpatient Prescriptions  Medication Sig Dispense Refill  . alfuzosin (UROXATRAL) 10 MG 24 hr tablet TAKE 1 TABLET DAILY.    Marland Kitchen aspirin 81 MG tablet Take 81 mg by mouth daily.    Marland Kitchen atorvastatin (LIPITOR) 20 MG tablet Take 20 mg by mouth daily.    Marland Kitchen azaTHIOprine (IMURAN) 50 MG tablet Take 100 mg by mouth daily with breakfast.     . Calcium Carb-Cholecalciferol (CALCIUM 1000  + D) 1000-800 MG-UNIT TABS Take 1 tablet by mouth daily.    Marland Kitchen esomeprazole (NEXIUM) 40 MG capsule Take 40 mg by mouth daily before breakfast.    . fish oil-omega-3 fatty acids 1000 MG capsule Take 2 g by mouth daily.    . nadolol (CORGARD) 40 MG tablet Take 40 mg by mouth daily with breakfast.     . tadalafil (CIALIS) 5 MG tablet Take 5 mg by mouth daily as needed for erectile dysfunction.     No current facility-administered medications for this visit.      Allergies:  Allergies  Allergen Reactions  . Latex     Burns skin off  . Shellfish Allergy Anaphylaxis and Swelling    Legs swell and throat puffed out  . Gluten     Upset stomach   . Sulfa Antibiotics     Pass out    Past Medical History, Surgical history, Social history, and Family History were reviewed and updated.   Physical Exam: Blood pressure 107/62, pulse 66, temperature 98.1 F (36.7 C), temperature source Oral, resp. rate 18, height 6' 4"  (1.93 m), weight 266 lb 9.6 oz (120.9 kg), SpO2 100 %. ECOG: 1 General appearance: Alert, awake gentleman without distress. Head: Normocephalic, without obvious abnormality no oral ulcers or thrush. Neck: no adenopathy no masses. No hematoma detected. Lymph nodes: Cervical, supraclavicular, and axillary nodes normal. Heart:regular rate and rhythm, S1, S2 normal, no murmur, click, rub or gallop Lung:chest clear, no wheezing, rales, normal symmetric air entry Abdomin: soft, non-tender, without masses or organomegaly no rebound or guarding. EXT:no erythema, induration, or nodules   Lab Results:  Lab Results  Component Value Date   WBC 1.4 (L) 11/29/2016   HGB 8.4 (L) 11/29/2016   HCT 26.7 (L) 11/29/2016   MCV 80.4 11/29/2016   PLT 76 (L) 11/29/2016     Chemistry      Component Value Date/Time   NA 138 04/12/2016 0743   K 4.7 04/12/2016 0743   CL 104 04/12/2016 0743   CO2 24 04/12/2016 0743   BUN 9 04/12/2016 0743   CREATININE 0.84 04/12/2016 0743   CREATININE 0.93  03/11/2015 1521      Component Value Date/Time   CALCIUM 8.4 (L) 04/12/2016 0743   ALKPHOS 62 01/28/2014 0931   AST 45 (H) 01/28/2014 0931   ALT 20 01/28/2014 0931   BILITOT 1.0 01/28/2014 0931       Impression and Plan:  66 year old gentleman with the following issues:  1. Thrombocytopenia and leukocytopenia: This is due to splenomegaly related to portal hypertension. 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   His white count appears adequate at this time and does not require any transfusion at this time.  2. Anemia: Related to iron deficiency and I have recommended iron replacements. He reports he could not tolerate oral iron and I have recommended intravenous iron. He will consider that for future visits and we'll continue to monitor his iron levels.  3. Follow-up: Will be in 3 months.   Zola Button, MD 9/26/20189:32 AM

## 2016-11-29 NOTE — Telephone Encounter (Signed)
Gave avs and calendar for January 2019

## 2016-12-18 DIAGNOSIS — L821 Other seborrheic keratosis: Secondary | ICD-10-CM | POA: Diagnosis not present

## 2016-12-18 DIAGNOSIS — Z85828 Personal history of other malignant neoplasm of skin: Secondary | ICD-10-CM | POA: Diagnosis not present

## 2016-12-18 DIAGNOSIS — L570 Actinic keratosis: Secondary | ICD-10-CM | POA: Diagnosis not present

## 2016-12-18 DIAGNOSIS — L812 Freckles: Secondary | ICD-10-CM | POA: Diagnosis not present

## 2016-12-18 DIAGNOSIS — D1721 Benign lipomatous neoplasm of skin and subcutaneous tissue of right arm: Secondary | ICD-10-CM | POA: Diagnosis not present

## 2017-01-03 DIAGNOSIS — Z23 Encounter for immunization: Secondary | ICD-10-CM | POA: Diagnosis not present

## 2017-01-05 DIAGNOSIS — K754 Autoimmune hepatitis: Secondary | ICD-10-CM | POA: Diagnosis not present

## 2017-01-05 DIAGNOSIS — R1310 Dysphagia, unspecified: Secondary | ICD-10-CM | POA: Diagnosis not present

## 2017-01-05 DIAGNOSIS — I8500 Esophageal varices without bleeding: Secondary | ICD-10-CM | POA: Diagnosis not present

## 2017-01-08 DIAGNOSIS — Z6839 Body mass index (BMI) 39.0-39.9, adult: Secondary | ICD-10-CM | POA: Diagnosis not present

## 2017-01-08 DIAGNOSIS — E669 Obesity, unspecified: Secondary | ICD-10-CM | POA: Diagnosis not present

## 2017-01-08 DIAGNOSIS — R0683 Snoring: Secondary | ICD-10-CM | POA: Diagnosis not present

## 2017-01-09 DIAGNOSIS — N138 Other obstructive and reflux uropathy: Secondary | ICD-10-CM | POA: Diagnosis not present

## 2017-01-09 DIAGNOSIS — N401 Enlarged prostate with lower urinary tract symptoms: Secondary | ICD-10-CM | POA: Diagnosis not present

## 2017-01-09 DIAGNOSIS — N411 Chronic prostatitis: Secondary | ICD-10-CM | POA: Diagnosis not present

## 2017-02-15 DIAGNOSIS — N3289 Other specified disorders of bladder: Secondary | ICD-10-CM | POA: Diagnosis not present

## 2017-02-15 DIAGNOSIS — N400 Enlarged prostate without lower urinary tract symptoms: Secondary | ICD-10-CM | POA: Diagnosis not present

## 2017-02-15 DIAGNOSIS — K754 Autoimmune hepatitis: Secondary | ICD-10-CM | POA: Diagnosis not present

## 2017-02-15 DIAGNOSIS — Z9889 Other specified postprocedural states: Secondary | ICD-10-CM | POA: Diagnosis not present

## 2017-02-15 DIAGNOSIS — K219 Gastro-esophageal reflux disease without esophagitis: Secondary | ICD-10-CM | POA: Diagnosis not present

## 2017-02-15 DIAGNOSIS — E785 Hyperlipidemia, unspecified: Secondary | ICD-10-CM | POA: Diagnosis not present

## 2017-02-15 DIAGNOSIS — K5100 Ulcerative (chronic) pancolitis without complications: Secondary | ICD-10-CM | POA: Diagnosis not present

## 2017-02-15 DIAGNOSIS — D696 Thrombocytopenia, unspecified: Secondary | ICD-10-CM | POA: Diagnosis not present

## 2017-02-16 DIAGNOSIS — N411 Chronic prostatitis: Secondary | ICD-10-CM | POA: Diagnosis not present

## 2017-02-16 DIAGNOSIS — N138 Other obstructive and reflux uropathy: Secondary | ICD-10-CM | POA: Diagnosis not present

## 2017-02-16 DIAGNOSIS — N3289 Other specified disorders of bladder: Secondary | ICD-10-CM | POA: Diagnosis not present

## 2017-02-16 DIAGNOSIS — N400 Enlarged prostate without lower urinary tract symptoms: Secondary | ICD-10-CM | POA: Diagnosis not present

## 2017-02-16 DIAGNOSIS — D696 Thrombocytopenia, unspecified: Secondary | ICD-10-CM | POA: Diagnosis not present

## 2017-02-16 DIAGNOSIS — E785 Hyperlipidemia, unspecified: Secondary | ICD-10-CM | POA: Diagnosis not present

## 2017-02-16 DIAGNOSIS — N401 Enlarged prostate with lower urinary tract symptoms: Secondary | ICD-10-CM | POA: Diagnosis not present

## 2017-02-16 DIAGNOSIS — K219 Gastro-esophageal reflux disease without esophagitis: Secondary | ICD-10-CM | POA: Diagnosis not present

## 2017-02-16 DIAGNOSIS — K754 Autoimmune hepatitis: Secondary | ICD-10-CM | POA: Diagnosis not present

## 2017-02-17 DIAGNOSIS — D696 Thrombocytopenia, unspecified: Secondary | ICD-10-CM | POA: Diagnosis not present

## 2017-02-17 DIAGNOSIS — E785 Hyperlipidemia, unspecified: Secondary | ICD-10-CM | POA: Diagnosis not present

## 2017-02-17 DIAGNOSIS — K754 Autoimmune hepatitis: Secondary | ICD-10-CM | POA: Diagnosis not present

## 2017-02-17 DIAGNOSIS — N400 Enlarged prostate without lower urinary tract symptoms: Secondary | ICD-10-CM | POA: Diagnosis not present

## 2017-02-17 DIAGNOSIS — N3289 Other specified disorders of bladder: Secondary | ICD-10-CM | POA: Diagnosis not present

## 2017-02-17 DIAGNOSIS — K219 Gastro-esophageal reflux disease without esophagitis: Secondary | ICD-10-CM | POA: Diagnosis not present

## 2017-02-19 DIAGNOSIS — R338 Other retention of urine: Secondary | ICD-10-CM | POA: Diagnosis not present

## 2017-02-19 DIAGNOSIS — N9989 Other postprocedural complications and disorders of genitourinary system: Secondary | ICD-10-CM | POA: Diagnosis not present

## 2017-02-23 DIAGNOSIS — R339 Retention of urine, unspecified: Secondary | ICD-10-CM | POA: Diagnosis not present

## 2017-02-26 DIAGNOSIS — R339 Retention of urine, unspecified: Secondary | ICD-10-CM | POA: Diagnosis not present

## 2017-03-12 DIAGNOSIS — N3000 Acute cystitis without hematuria: Secondary | ICD-10-CM | POA: Diagnosis not present

## 2017-03-13 DIAGNOSIS — R633 Feeding difficulties: Secondary | ICD-10-CM | POA: Diagnosis not present

## 2017-03-13 DIAGNOSIS — R1310 Dysphagia, unspecified: Secondary | ICD-10-CM | POA: Diagnosis not present

## 2017-03-20 ENCOUNTER — Inpatient Hospital Stay: Payer: BLUE CROSS/BLUE SHIELD | Attending: Oncology | Admitting: Oncology

## 2017-03-20 ENCOUNTER — Inpatient Hospital Stay: Payer: BLUE CROSS/BLUE SHIELD

## 2017-03-20 ENCOUNTER — Telehealth: Payer: Self-pay | Admitting: Oncology

## 2017-03-20 ENCOUNTER — Telehealth: Payer: Self-pay | Admitting: *Deleted

## 2017-03-20 VITALS — BP 129/64 | HR 63 | Temp 98.1°F | Resp 20 | Ht 76.0 in | Wt 275.4 lb

## 2017-03-20 DIAGNOSIS — I959 Hypotension, unspecified: Secondary | ICD-10-CM | POA: Diagnosis not present

## 2017-03-20 DIAGNOSIS — K7460 Unspecified cirrhosis of liver: Secondary | ICD-10-CM | POA: Insufficient documentation

## 2017-03-20 DIAGNOSIS — D649 Anemia, unspecified: Secondary | ICD-10-CM

## 2017-03-20 DIAGNOSIS — I440 Atrioventricular block, first degree: Secondary | ICD-10-CM | POA: Insufficient documentation

## 2017-03-20 DIAGNOSIS — D509 Iron deficiency anemia, unspecified: Secondary | ICD-10-CM

## 2017-03-20 DIAGNOSIS — T451X5A Adverse effect of antineoplastic and immunosuppressive drugs, initial encounter: Secondary | ICD-10-CM | POA: Diagnosis not present

## 2017-03-20 DIAGNOSIS — R001 Bradycardia, unspecified: Secondary | ICD-10-CM | POA: Insufficient documentation

## 2017-03-20 DIAGNOSIS — D638 Anemia in other chronic diseases classified elsewhere: Secondary | ICD-10-CM

## 2017-03-20 DIAGNOSIS — D696 Thrombocytopenia, unspecified: Secondary | ICD-10-CM | POA: Insufficient documentation

## 2017-03-20 DIAGNOSIS — R0789 Other chest pain: Secondary | ICD-10-CM | POA: Insufficient documentation

## 2017-03-20 LAB — CBC WITH DIFFERENTIAL/PLATELET
Basophils Absolute: 0 10*3/uL (ref 0.0–0.1)
Basophils Relative: 1 %
Eosinophils Absolute: 0.1 10*3/uL (ref 0.0–0.5)
Eosinophils Relative: 8 %
HCT: 28 % — ABNORMAL LOW (ref 38.4–49.9)
Hemoglobin: 8.1 g/dL — ABNORMAL LOW (ref 13.0–17.1)
Lymphocytes Relative: 18 %
Lymphs Abs: 0.2 10*3/uL — ABNORMAL LOW (ref 0.9–3.3)
MCH: 23.5 pg — ABNORMAL LOW (ref 27.2–33.4)
MCHC: 28.9 g/dL — ABNORMAL LOW (ref 32.0–36.0)
MCV: 81.4 fL (ref 79.3–98.0)
Monocytes Absolute: 0.2 10*3/uL (ref 0.1–0.9)
Monocytes Relative: 14 %
Neutro Abs: 0.8 10*3/uL — ABNORMAL LOW (ref 1.5–6.5)
Neutrophils Relative %: 59 %
Platelets: 56 10*3/uL — ABNORMAL LOW (ref 140–400)
RBC: 3.44 MIL/uL — ABNORMAL LOW (ref 4.20–5.82)
RDW: 18.3 % — ABNORMAL HIGH (ref 11.0–15.6)
WBC: 1.3 10*3/uL — ABNORMAL LOW (ref 4.0–10.3)

## 2017-03-20 LAB — IRON AND TIBC
Iron: 19 ug/dL — ABNORMAL LOW (ref 42–163)
Saturation Ratios: 5 % — ABNORMAL LOW (ref 42–163)
TIBC: 371 ug/dL (ref 202–409)
UIBC: 352 ug/dL

## 2017-03-20 LAB — FERRITIN: Ferritin: 9 ng/mL — ABNORMAL LOW (ref 22–316)

## 2017-03-20 NOTE — Progress Notes (Signed)
Hematology and Oncology Follow Up Visit  Corey Shannon [id number] Jun 23, 1950 68 y.o. 03/20/2017 9:25 AM Corey Shannon, MDHusain, Corey Ar, MD   Principle Diagnosis: 67 year old with:  1. 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 These findings documented since 2011.  He has history of hepatitis and cirrhosis of the liver resulting in 2 splenomegaly.  2. Iron deficiency.  Current therapy: Oral iron supplements on a daily basis.  Interim History: Mr. Corey Shannon is here for a follow-up visit by himself. Since the last visit, he underwent a TURP procedure without complications.  He reports his urination has improved since that time without any complications.  He denies any hematuria prior to the procedure.  After the procedure he did have mild hematuria resolved spontaneously.  He reports he is eating well with excellent appetite.  He denies any GI complaints including abdominal distention or early satiety.  He does report some constipation and dyspepsia related to oral iron supplements. He denies any hematochezia, melena or hemoptysis.  He does not report any headaches, blurry vision, syncope or seizures. He does not report any fevers, chills or sweats. He does not report any cough, wheezing or hemoptysis. Does not report any nausea, vomiting, abdominal pain. He does not report any frequency, urgency or hesitancy. He does not report any skeletal complaints of arthralgias myalgias. Remaining review of systems is negative.   Medications: I have reviewed the patient's current medications.  Current Outpatient Medications  Medication Sig Dispense Refill  . alfuzosin (UROXATRAL) 10 MG 24 hr tablet TAKE 1 TABLET DAILY.    Marland Kitchen aspirin 81 MG tablet Take 81 mg by mouth daily.    Marland Kitchen atorvastatin (LIPITOR) 20 MG tablet Take 20 mg by mouth daily.    Marland Kitchen azaTHIOprine (IMURAN) 50 MG tablet Take 100 mg by mouth daily with breakfast.     . Calcium  Carb-Cholecalciferol (CALCIUM 1000 + D) 1000-800 MG-UNIT TABS Take 1 tablet by mouth daily.    Marland Kitchen esomeprazole (NEXIUM) 40 MG capsule Take 40 mg by mouth daily before breakfast.    . fish oil-omega-3 fatty acids 1000 MG capsule Take 2 g by mouth daily.    . nadolol (CORGARD) 40 MG tablet Take 40 mg by mouth daily with breakfast.     . tadalafil (CIALIS) 5 MG tablet Take 5 mg by mouth daily as needed for erectile dysfunction.     No current facility-administered medications for this visit.      Allergies:  Allergies  Allergen Reactions  . Latex     Burns skin off  . Shellfish Allergy Anaphylaxis and Swelling    Legs swell and throat puffed out  . Gluten     Upset stomach   . Sulfa Antibiotics     Pass out    Past Medical History, Surgical history, Social history, and Family History were reviewed and updated.   Physical Exam: Blood pressure 129/64, pulse 63, temperature 98.1 F (36.7 C), temperature source Oral, resp. rate 20, height 6' 4"  (1.93 m), weight 275 lb 6.4 oz (124.9 kg), SpO2 100 %. ECOG: 1 General appearance: Well-appearing gentleman without distress. Head: Normocephalic, without obvious abnormality  Oral mucosa: Without any ulcers or thrush. Eyes: No scleral icterus. Lymph nodes: Cervical, supraclavicular, and axillary nodes normal. Heart: Regular rate.  No murmurs or gallops. Lung:chest clear, no wheezing, rales, normal symmetric air entry Abdomin: soft, non-tender, without masses or organomegaly no rebound or guarding.  No shifting dullness or ascites. Skeletal: No joint tenderness  or effusion. HEENT: No erythema or petechiae.  Lab Results: Lab Results  Component Value Date   WBC 1.3 (L) 03/20/2017   HGB 8.1 (L) 03/20/2017   HCT 28.0 (L) 03/20/2017   MCV 81.4 03/20/2017   PLT 56 (L) 03/20/2017     Chemistry      Component Value Date/Time   NA 138 04/12/2016 0743   K 4.7 04/12/2016 0743   CL 104 04/12/2016 0743   CO2 24 04/12/2016 0743   BUN 9  04/12/2016 0743   CREATININE 0.84 04/12/2016 0743   CREATININE 0.93 03/11/2015 1521      Component Value Date/Time   CALCIUM 8.4 (L) 04/12/2016 0743   ALKPHOS 62 01/28/2014 0931   AST 45 (H) 01/28/2014 0931   ALT 20 01/28/2014 0931   BILITOT 1.0 01/28/2014 0931       Impression and Plan:  67 year old gentleman with the following issues:  1. 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  His platelet count appears adequate at this time without any active bleeding.  His white count was reviewed again today and appeared adequate.  No intervention is needed at this time.  He does not require any transfusions or growth factor support at this time.  2. Anemia: Due to iron deficiency and cirrhosis of the liver.  Iron studies on 05/31/2016 showed iron deficiency and oral iron has not been effective.  He is reporting also intolerance to oral iron.  Risks and benefits of Feraheme intravenous iron infusion were reviewed.  These complications include arthralgias, myalgias and infusion related complications.  After discussing the risks and benefits he is agreeable to proceed with intravenous iron instead of oral iron  3. Follow-up: Will be in 3 months to repeat iron studies.  15 minutes was spent with the patient face-to-face today.  More than 50% of the time was dedicated for education, counseling and coordination of care.  Corey Button, MD 1/15/20199:25 AM

## 2017-03-20 NOTE — Telephone Encounter (Signed)
Gave avs and calendar for January and april

## 2017-03-20 NOTE — Telephone Encounter (Signed)
Spoke with patient's wife, per dr Alen Blew, his iron is still low and he will need to keep appt for iron infusion

## 2017-03-20 NOTE — Telephone Encounter (Signed)
-----   Message from Wyatt Portela, MD sent at 03/20/2017 10:15 AM EST ----- Please let him know his iron still low and will need iron infusion as scheduled.

## 2017-03-28 ENCOUNTER — Inpatient Hospital Stay: Payer: BLUE CROSS/BLUE SHIELD

## 2017-03-28 VITALS — BP 108/54 | HR 58 | Temp 98.0°F | Resp 16

## 2017-03-28 DIAGNOSIS — R0789 Other chest pain: Secondary | ICD-10-CM | POA: Diagnosis not present

## 2017-03-28 DIAGNOSIS — I959 Hypotension, unspecified: Secondary | ICD-10-CM | POA: Diagnosis not present

## 2017-03-28 DIAGNOSIS — T451X5A Adverse effect of antineoplastic and immunosuppressive drugs, initial encounter: Secondary | ICD-10-CM | POA: Diagnosis not present

## 2017-03-28 DIAGNOSIS — R001 Bradycardia, unspecified: Secondary | ICD-10-CM | POA: Diagnosis not present

## 2017-03-28 DIAGNOSIS — D638 Anemia in other chronic diseases classified elsewhere: Secondary | ICD-10-CM | POA: Diagnosis not present

## 2017-03-28 DIAGNOSIS — D509 Iron deficiency anemia, unspecified: Secondary | ICD-10-CM | POA: Diagnosis not present

## 2017-03-28 DIAGNOSIS — I440 Atrioventricular block, first degree: Secondary | ICD-10-CM | POA: Diagnosis not present

## 2017-03-28 DIAGNOSIS — K7460 Unspecified cirrhosis of liver: Secondary | ICD-10-CM | POA: Diagnosis not present

## 2017-03-28 DIAGNOSIS — D696 Thrombocytopenia, unspecified: Secondary | ICD-10-CM | POA: Diagnosis not present

## 2017-03-28 MED ORDER — SODIUM CHLORIDE 0.9 % IV SOLN
Freq: Once | INTRAVENOUS | Status: AC
  Administered 2017-03-28: 09:00:00 via INTRAVENOUS

## 2017-03-28 MED ORDER — SODIUM CHLORIDE 0.9 % IV SOLN
510.0000 mg | Freq: Once | INTRAVENOUS | Status: AC
  Administered 2017-03-28: 510 mg via INTRAVENOUS
  Filled 2017-03-28: qty 17

## 2017-03-28 NOTE — Patient Instructions (Signed)

## 2017-03-29 DIAGNOSIS — R338 Other retention of urine: Secondary | ICD-10-CM | POA: Diagnosis not present

## 2017-03-29 DIAGNOSIS — N401 Enlarged prostate with lower urinary tract symptoms: Secondary | ICD-10-CM | POA: Diagnosis not present

## 2017-03-29 DIAGNOSIS — Z9079 Acquired absence of other genital organ(s): Secondary | ICD-10-CM | POA: Diagnosis not present

## 2017-03-29 DIAGNOSIS — N138 Other obstructive and reflux uropathy: Secondary | ICD-10-CM | POA: Diagnosis not present

## 2017-04-02 DIAGNOSIS — K5100 Ulcerative (chronic) pancolitis without complications: Secondary | ICD-10-CM | POA: Diagnosis not present

## 2017-04-04 ENCOUNTER — Other Ambulatory Visit: Payer: Self-pay | Admitting: Medical

## 2017-04-04 ENCOUNTER — Inpatient Hospital Stay: Payer: BLUE CROSS/BLUE SHIELD

## 2017-04-04 ENCOUNTER — Other Ambulatory Visit: Payer: Self-pay

## 2017-04-04 ENCOUNTER — Inpatient Hospital Stay (HOSPITAL_BASED_OUTPATIENT_CLINIC_OR_DEPARTMENT_OTHER): Payer: BLUE CROSS/BLUE SHIELD | Admitting: Medical

## 2017-04-04 VITALS — BP 102/55 | HR 51 | Temp 98.0°F | Resp 18

## 2017-04-04 DIAGNOSIS — I440 Atrioventricular block, first degree: Secondary | ICD-10-CM | POA: Diagnosis not present

## 2017-04-04 DIAGNOSIS — R001 Bradycardia, unspecified: Secondary | ICD-10-CM

## 2017-04-04 DIAGNOSIS — D696 Thrombocytopenia, unspecified: Secondary | ICD-10-CM | POA: Diagnosis not present

## 2017-04-04 DIAGNOSIS — D509 Iron deficiency anemia, unspecified: Secondary | ICD-10-CM | POA: Diagnosis not present

## 2017-04-04 DIAGNOSIS — I959 Hypotension, unspecified: Secondary | ICD-10-CM | POA: Diagnosis not present

## 2017-04-04 DIAGNOSIS — T8090XA Unspecified complication following infusion and therapeutic injection, initial encounter: Secondary | ICD-10-CM

## 2017-04-04 DIAGNOSIS — R0789 Other chest pain: Secondary | ICD-10-CM

## 2017-04-04 DIAGNOSIS — T451X5A Adverse effect of antineoplastic and immunosuppressive drugs, initial encounter: Secondary | ICD-10-CM | POA: Diagnosis not present

## 2017-04-04 DIAGNOSIS — K7460 Unspecified cirrhosis of liver: Secondary | ICD-10-CM | POA: Diagnosis not present

## 2017-04-04 DIAGNOSIS — D638 Anemia in other chronic diseases classified elsewhere: Secondary | ICD-10-CM | POA: Diagnosis not present

## 2017-04-04 MED ORDER — SODIUM CHLORIDE 0.9 % IV SOLN
Freq: Once | INTRAVENOUS | Status: AC
  Administered 2017-04-04: 09:00:00 via INTRAVENOUS

## 2017-04-04 MED ORDER — SODIUM CHLORIDE 0.9 % IV SOLN
510.0000 mg | Freq: Once | INTRAVENOUS | Status: AC
  Administered 2017-04-04: 510 mg via INTRAVENOUS
  Filled 2017-04-04: qty 17

## 2017-04-04 NOTE — Progress Notes (Signed)
[facility] Progress Note   [name] [id number] 08-26-1950 67 y.o.  SAMAAD [name] is managed by Dr. Alen Blew  Actively treated with chemotherapy: no  Current Therapy: Feraheme   Last Treated: 04/04/2017  Assessment: Plan:    Infusion reaction, initial encounter  [name] was seen in the infusion room for a suspected infusion reaction. He was receiving Feraheme at the time of his reaction. He had completed his infusion and was at the end of his 30-minute observation period. His symptoms included: Bradycardia, chest pressure and mild hypotension He was not premedicated prior to starting chemotherapy. [name] was given a bolus of IV normal saline after onset of his symptoms. [name] did  respond to intervention.  An EKG was completed that showed sinus bradycardia with first-degree AV block with a rate of 53 bpm.   This case was discussed with Dr. Alen Blew , who directed that the following be done: Patient was discharged to home and was instructed to present to the emergency room or call 911 should he develop presyncope, syncope, chest pain, diaphoresis, or shortness of breath.  Please see After Visit Summary for patient specific instructions.  Future Appointments  Date Time Provider [place]  06/19/2017  8:30 AM [facility] 1 [facility] None  06/19/2017  9:00 AM Shadad, Mathis Dad, MD [facility] None    No orders of the defined types were placed in this encounter.      Subjective:   Patient ID:  Corey Shannon is a 67 y.o. (DOB [birth year]) male.  Chief Complaint: No chief complaint on file.   HPI Corey Shannon was seen in the infusion room for a suspected infusion reaction. He was receiving Feraheme at the time of his reaction. He had completed his infusion and was at the end of his 30-minute observation period. His symptoms included: Bradycardia, chest pressure and mild hypotension. He was not premedicated prior to  starting chemotherapy. [name] was given a bolus of IV normal saline after onset of his symptoms. [name] did  respond to intervention.  An EKG was completed that showed sinus bradycardia with first-degree AV block with a rate of 53 bpm. This case was discussed with Dr. Alen Blew , who directed that the following be done: Patient was discharged to home and was instructed to present to the emergency room or call 911 should he develop presyncope, syncope, chest pain, diaphoresis, or shortness of breath.  Medications: I have reviewed the patient's current medications.  Allergies:  Allergies  Allergen Reactions  . Latex     Burns skin off  . Shellfish Allergy Anaphylaxis and Swelling    Legs swell and throat puffed out  . Gluten     Upset stomach   . Sulfa Antibiotics     Pass out    Past Medical History:  Diagnosis Date  . Arthritis   . Autoimmune hepatitis ([place]) dx 2005   secondary to gluten allergy  . GERD (gastroesophageal reflux disease)   . Gluten intolerance   . History of nuclear stress test    Myoview 1/17: EF 61%, inferior and inferolateral ischemia, low risk  . Hyperlipidemia   . Pneumonia     Past Surgical History:  Procedure Laterality Date  . BACK SURGERY    . CHOLECYSTECTOMY    . ESOPHAGEAL BANDING N/A 11/29/2012   Procedure: ESOPHAGEAL BANDING;  Surgeon: Beryle Beams, MD;  Location: [facility];  Service: Endoscopy;  Laterality:  N/A;  . ESOPHAGEAL BANDING N/A 01/24/2013   Procedure: ESOPHAGEAL BANDING;  Surgeon: Beryle Beams, MD;  Location: [facility];  Service: Endoscopy;  Laterality: N/A;  . ESOPHAGEAL BANDING N/A 02/21/2013   Procedure: ESOPHAGEAL BANDING;  Surgeon: Beryle Beams, MD;  Location: [facility];  Service: Endoscopy;  Laterality: N/A;  . ESOPHAGEAL BANDING N/A 03/20/2014   Procedure: ESOPHAGEAL BANDING;  Surgeon: Beryle Beams, MD;  Location: [facility];  Service: Endoscopy;  Laterality: N/A;  . ESOPHAGOGASTRODUODENOSCOPY N/A  11/29/2012   Procedure: ESOPHAGOGASTRODUODENOSCOPY (EGD);  Surgeon: Beryle Beams, MD;  Location: [facility];  Service: Endoscopy;  Laterality: N/A;  . ESOPHAGOGASTRODUODENOSCOPY N/A 01/24/2013   Procedure: ESOPHAGOGASTRODUODENOSCOPY (EGD);  Surgeon: Beryle Beams, MD;  Location: [facility];  Service: Endoscopy;  Laterality: N/A;  . ESOPHAGOGASTRODUODENOSCOPY N/A 02/21/2013   Procedure: ESOPHAGOGASTRODUODENOSCOPY (EGD);  Surgeon: Beryle Beams, MD;  Location: [facility];  Service: Endoscopy;  Laterality: N/A;  . ESOPHAGOGASTRODUODENOSCOPY (EGD) WITH PROPOFOL N/A 03/20/2014   Procedure: ESOPHAGOGASTRODUODENOSCOPY (EGD) WITH PROPOFOL;  Surgeon: Beryle Beams, MD;  Location: [facility];  Service: Endoscopy;  Laterality: N/A;  . SPINE SURGERY     x 4, 9 disc fusion    Family History  Problem Relation Age of Onset  . Osteoporosis Mother   . Heart attack Father   . Heart disease Father   . Alzheimer's disease Father   . Stroke Father     Social History   Socioeconomic History  . Marital status: Married    Spouse name: Not on file  . Number of children: Not on file  . Years of education: Not on file  . Highest education level: Not on file  Social Needs  . Financial resource strain: Not on file  . Food insecurity - worry: Not on file  . Food insecurity - inability: Not on file  . Transportation needs - medical: Not on file  . Transportation needs - non-medical: Not on file  Occupational History  . Not on file  Tobacco Use  . Smoking status: Never Smoker  . Smokeless tobacco: Never Used  Substance and Sexual Activity  . Alcohol use: Yes    Comment: wine weekly-3 glasses  . Drug use: No  . Sexual activity: Not on file  Other Topics Concern  . Not on file  Social History Narrative  . Not on file    Past Medical History, Surgical history, Social history, and Family history were reviewed and updated as appropriate.   Please see review of systems for further details  on the patient's review from today.   Review of Systems:  Review of Systems  Constitutional: Negative for chills, diaphoresis and fever.  HENT: Negative for trouble swallowing.   Respiratory: Negative for cough, choking, shortness of breath and wheezing.   Cardiovascular: Negative for palpitations.       Left chest pressure  Gastrointestinal: Negative for nausea and vomiting.    Objective:   Physical Exam:  There were no vitals taken for this visit.  Physical Exam  Constitutional: No distress.  HENT:  Head: Normocephalic and atraumatic.  Cardiovascular: S1 normal and S2 normal. Bradycardia present. Exam reveals no friction rub.  No murmur heard. Pulmonary/Chest: Effort normal and breath sounds normal. No respiratory distress. He has no wheezes. He has no rales.  Neurological: He is alert.  Skin: Skin is warm and dry. No rash noted. He is not diaphoretic. No erythema.    Lab Review:     Component Value Date/Time  NA 138 04/12/2016 0743   K 4.7 04/12/2016 0743   CL 104 04/12/2016 0743   CO2 24 04/12/2016 0743   GLUCOSE 90 04/12/2016 0743   GLUCOSE 80 03/11/2015 1521   BUN 9 04/12/2016 0743   CREATININE 0.84 04/12/2016 0743   CREATININE 0.93 03/11/2015 1521   CALCIUM 8.4 (L) 04/12/2016 0743   PROT 7.6 01/28/2014 0931   ALBUMIN 3.8 01/28/2014 0931   AST 45 (H) 01/28/2014 0931   ALT 20 01/28/2014 0931   ALKPHOS 62 01/28/2014 0931   BILITOT 1.0 01/28/2014 0931   GFRNONAA 93 04/12/2016 0743   GFRAA 108 04/12/2016 0743       Component Value Date/Time   WBC 1.3 (L) 03/20/2017 0828   RBC 3.44 (L) 03/20/2017 0828   HGB 8.1 (L) 03/20/2017 0828   HGB 8.4 (L) 11/29/2016 0831   HCT 28.0 (L) 03/20/2017 0828   HCT 26.7 (L) 11/29/2016 0831   PLT 56 (L) 03/20/2017 0828   PLT 76 (L) 11/29/2016 0831   MCV 81.4 03/20/2017 0828   MCV 80.4 11/29/2016 0831   MCH 23.5 (L) 03/20/2017 0828   MCHC 28.9 (L) 03/20/2017 0828   RDW 18.3 (H) 03/20/2017 0828   RDW 20.9 (H) 11/29/2016  0831   LYMPHSABS 0.2 (L) 03/20/2017 0828   LYMPHSABS 0.3 (L) 11/29/2016 0831   MONOABS 0.2 03/20/2017 0828   MONOABS 0.1 11/29/2016 0831   EOSABS 0.1 03/20/2017 0828   EOSABS 0.1 11/29/2016 0831   BASOSABS 0.0 03/20/2017 0828   BASOSABS 0.0 11/29/2016 0831   -------------------------------  Imaging from last 24 hours (if applicable):  Radiology interpretation: No results found.

## 2017-04-04 NOTE — Progress Notes (Signed)
1100-BP 102/55, Pulse 51. EKG reviewed by Sandi Mealy, PA. Lucianne Lei discussed findings with patient. Per Lucianne Lei, Dr. Alen Blew stated patient is ok to leave. IV removed.

## 2017-04-04 NOTE — Progress Notes (Signed)
1028- Patient near end of 30 minute observation period post Feraheme infusion. Vital signs taken. BP86/47, Heart Rate= 43. Patient c/o of slight chest tightness. Sandi Mealy, PA notified and over to evaluate patient.  1045-EKG performed. Patient states chest tightness is easing off.

## 2017-04-04 NOTE — Patient Instructions (Signed)

## 2017-05-18 DIAGNOSIS — K5100 Ulcerative (chronic) pancolitis without complications: Secondary | ICD-10-CM | POA: Diagnosis not present

## 2017-05-22 DIAGNOSIS — D0462 Carcinoma in situ of skin of left upper limb, including shoulder: Secondary | ICD-10-CM | POA: Diagnosis not present

## 2017-05-22 DIAGNOSIS — L812 Freckles: Secondary | ICD-10-CM | POA: Diagnosis not present

## 2017-05-22 DIAGNOSIS — L570 Actinic keratosis: Secondary | ICD-10-CM | POA: Diagnosis not present

## 2017-05-22 DIAGNOSIS — L820 Inflamed seborrheic keratosis: Secondary | ICD-10-CM | POA: Diagnosis not present

## 2017-05-22 DIAGNOSIS — Z85828 Personal history of other malignant neoplasm of skin: Secondary | ICD-10-CM | POA: Diagnosis not present

## 2017-05-22 DIAGNOSIS — L821 Other seborrheic keratosis: Secondary | ICD-10-CM | POA: Diagnosis not present

## 2017-05-22 DIAGNOSIS — D1801 Hemangioma of skin and subcutaneous tissue: Secondary | ICD-10-CM | POA: Diagnosis not present

## 2017-06-07 DIAGNOSIS — Z125 Encounter for screening for malignant neoplasm of prostate: Secondary | ICD-10-CM | POA: Diagnosis not present

## 2017-06-07 DIAGNOSIS — Z1389 Encounter for screening for other disorder: Secondary | ICD-10-CM | POA: Diagnosis not present

## 2017-06-07 DIAGNOSIS — E7800 Pure hypercholesterolemia, unspecified: Secondary | ICD-10-CM | POA: Diagnosis not present

## 2017-06-07 DIAGNOSIS — Z0000 Encounter for general adult medical examination without abnormal findings: Secondary | ICD-10-CM | POA: Diagnosis not present

## 2017-06-19 ENCOUNTER — Inpatient Hospital Stay: Payer: BLUE CROSS/BLUE SHIELD

## 2017-06-19 ENCOUNTER — Inpatient Hospital Stay: Payer: BLUE CROSS/BLUE SHIELD | Attending: Oncology | Admitting: Oncology

## 2017-06-19 ENCOUNTER — Telehealth: Payer: Self-pay | Admitting: Oncology

## 2017-06-19 VITALS — BP 119/65 | HR 59 | Temp 97.9°F | Resp 17 | Ht 76.0 in | Wt 272.0 lb

## 2017-06-19 DIAGNOSIS — D696 Thrombocytopenia, unspecified: Secondary | ICD-10-CM | POA: Diagnosis not present

## 2017-06-19 DIAGNOSIS — K7460 Unspecified cirrhosis of liver: Secondary | ICD-10-CM | POA: Insufficient documentation

## 2017-06-19 DIAGNOSIS — R161 Splenomegaly, not elsewhere classified: Secondary | ICD-10-CM | POA: Diagnosis not present

## 2017-06-19 DIAGNOSIS — D509 Iron deficiency anemia, unspecified: Secondary | ICD-10-CM

## 2017-06-19 DIAGNOSIS — D72819 Decreased white blood cell count, unspecified: Secondary | ICD-10-CM | POA: Insufficient documentation

## 2017-06-19 DIAGNOSIS — T8090XA Unspecified complication following infusion and therapeutic injection, initial encounter: Secondary | ICD-10-CM

## 2017-06-19 LAB — CBC WITH DIFFERENTIAL (CANCER CENTER ONLY)
Basophils Absolute: 0 10*3/uL (ref 0.0–0.1)
Basophils Relative: 1 %
Eosinophils Absolute: 0.2 10*3/uL (ref 0.0–0.5)
Eosinophils Relative: 13 %
HCT: 34.6 % — ABNORMAL LOW (ref 38.4–49.9)
Hemoglobin: 10.8 g/dL — ABNORMAL LOW (ref 13.0–17.1)
Lymphocytes Relative: 21 %
Lymphs Abs: 0.3 10*3/uL — ABNORMAL LOW (ref 0.9–3.3)
MCH: 27.8 pg (ref 27.2–33.4)
MCHC: 31.2 g/dL — ABNORMAL LOW (ref 32.0–36.0)
MCV: 88.9 fL (ref 79.3–98.0)
Monocytes Absolute: 0.1 10*3/uL (ref 0.1–0.9)
Monocytes Relative: 6 %
Neutro Abs: 1 10*3/uL — ABNORMAL LOW (ref 1.5–6.5)
Neutrophils Relative %: 59 %
Platelet Count: 73 10*3/uL — ABNORMAL LOW (ref 140–400)
RBC: 3.89 MIL/uL — ABNORMAL LOW (ref 4.20–5.82)
RDW: 19.3 % — ABNORMAL HIGH (ref 11.0–14.6)
WBC Count: 1.7 10*3/uL — ABNORMAL LOW (ref 4.0–10.3)

## 2017-06-19 LAB — IRON AND TIBC
Iron: 27 ug/dL — ABNORMAL LOW (ref 42–163)
Saturation Ratios: 8 % — ABNORMAL LOW (ref 42–163)
TIBC: 343 ug/dL (ref 202–409)
UIBC: 316 ug/dL

## 2017-06-19 LAB — FERRITIN: Ferritin: 20 ng/mL — ABNORMAL LOW (ref 22–316)

## 2017-06-19 NOTE — Progress Notes (Signed)
Hematology and Oncology Follow Up Visit  Corey Shannon [id number] July 02, 1950 67 y.o. 06/19/2017 8:52 AM Corey Shannon, MDHusain, Denton Ar, MD   Principle Diagnosis: 67 year old with:  1. Thrombocytopenia diagnosed in 2011.  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   2. Iron deficiency anemia diagnosed in September 2017.  Presented with iron level of 19 and ferritin of 12.  Current therapy: IV iron replacement in the form of Feraheme as needed.  Last infusion was given January 2019.  Interim History: Corey Shannon is here for a follow-up.  Since the last visit, he reports improvement in his overall performance status and activity level.  He received iron infusion completed in January 2019 with no major complications.  He did report a mild infusion related reaction after the second infusion on April 04, 2017.  He reported chest tightness that resolved spontaneously.  He denies any hematochezia, melena and he is no longer taking oral iron.  He does not report any headaches, blurry vision, syncope or seizures. He does not report any fevers, chills or sweats. He does not report any cough, wheezing or hemoptysis. Does not report any nausea, vomiting, abdominal pain. He does not report any frequency, urgency or hesitancy. He does not report any  arthralgias myalgias.  He denies any skin rashes or lesions.  He denies any heat or cold intolerance.  He denies any anxiety or depression.  Remaining review of systems is negative.   Medications: I have reviewed the patient's current medications.  Current Outpatient Medications  Medication Sig Dispense Refill  . alfuzosin (UROXATRAL) 10 MG 24 hr tablet TAKE 1 TABLET DAILY.    Marland Kitchen aspirin 81 MG tablet Take 81 mg by mouth daily.    Marland Kitchen atorvastatin (LIPITOR) 20 MG tablet Take 20 mg by mouth daily.    Marland Kitchen azaTHIOprine (IMURAN) 50 MG tablet Take 100 mg by mouth daily with breakfast.     . Calcium  Carb-Cholecalciferol (CALCIUM 1000 + D) 1000-800 MG-UNIT TABS Take 1 tablet by mouth daily.    Marland Kitchen esomeprazole (NEXIUM) 40 MG capsule Take 40 mg by mouth daily before breakfast.    . fish oil-omega-3 fatty acids 1000 MG capsule Take 2 g by mouth daily.    . nadolol (CORGARD) 40 MG tablet Take 40 mg by mouth daily with breakfast.     . tadalafil (CIALIS) 5 MG tablet Take 5 mg by mouth daily as needed for erectile dysfunction.     No current facility-administered medications for this visit.      Allergies:  Allergies  Allergen Reactions  . Latex     Burns skin off  . Shellfish Allergy Anaphylaxis and Swelling    Legs swell and throat puffed out  . Gluten     Upset stomach   . Sulfa Antibiotics     Pass out    Past Medical History, Surgical history, Social history, and Family History reviewed and remain unchanged.   Physical Exam: Blood pressure 119/65, pulse (!) 59, temperature 97.9 F (36.6 C), temperature source Oral, resp. rate 17, height 6' 4"  (1.93 m), weight 272 lb (123.4 kg), SpO2 100 %.   ECOG: 1 General appearance: Alert, awake gentleman without distress. Head: Atraumatic without abnormalities. Oral mucosa: Mucous membranes are moist and pink. Eyes: Pupils are equal and round reactive to light. Lymph nodes: No lymphadenopathy palpated in the cervical, supraclavicular or axillary regions. Heart: Regular rate and rhythm without any murmurs rubs or gallops. Lung:  Clear to auscultation without any rhonchi, wheezes or dullness to percussion. Abdomin: Soft, nontender without any rebound or guarding. Skeletal: No no clubbing or cyanosis. Skin: No rashes or lesions.  Mild erythema noted on his hands.  Lab Results: Lab Results  Component Value Date   WBC 1.3 (L) 03/20/2017   HGB 8.1 (L) 03/20/2017   HCT 28.0 (L) 03/20/2017   MCV 81.4 03/20/2017   PLT 56 (L) 03/20/2017     Chemistry      Component Value Date/Time   NA 138 04/12/2016 0743   K 4.7 04/12/2016 0743    CL 104 04/12/2016 0743   CO2 24 04/12/2016 0743   BUN 9 04/12/2016 0743   CREATININE 0.84 04/12/2016 0743   CREATININE 0.93 03/11/2015 1521      Component Value Date/Time   CALCIUM 8.4 (L) 04/12/2016 0743   ALKPHOS 62 01/28/2014 0931   AST 45 (H) 01/28/2014 0931   ALT 20 01/28/2014 0931   BILITOT 1.0 01/28/2014 0931       Impression and Plan:  67 year old man with  1. Thrombocytopenia: This is related to splenic sequestration as a consequence of his cirrhosis of the liver and splenomegaly.  His platelet count continues to be adequate time without any active bleeding.  The natural course of this disease was discussed today and he will continue to have chronic thrombocytopenia at this time.  No intervention is needed unless bleeding is happening or surgical procedure is anticipated.  At that time boosting his platelet counts may be needed but no need at this time.   2.  Iron deficiency anemia: Status post intravenous iron completed in January 2019.  His hemoglobin is much improved at this time and he has less symptoms.  We will continue to monitor his iron periodically and replace as needed.  3.  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  No recent infections and no need for growth factor support.  4. Follow-up: Will be in 6 months to follow his progress.  15 minutes was spent with the patient face-to-face today.  More than 50% of the time was dedicated for education, counseling and answering questions regarding his future plan of care.  Zola Button, MD 4/16/20198:52 AM

## 2017-06-19 NOTE — Telephone Encounter (Signed)
Appts scheduled AVS/Calendar printed per 4/16 los

## 2017-06-20 DIAGNOSIS — K5100 Ulcerative (chronic) pancolitis without complications: Secondary | ICD-10-CM | POA: Diagnosis not present

## 2017-06-26 DIAGNOSIS — Z9079 Acquired absence of other genital organ(s): Secondary | ICD-10-CM | POA: Diagnosis not present

## 2017-06-26 DIAGNOSIS — N411 Chronic prostatitis: Secondary | ICD-10-CM | POA: Diagnosis not present

## 2017-06-26 DIAGNOSIS — N400 Enlarged prostate without lower urinary tract symptoms: Secondary | ICD-10-CM | POA: Diagnosis not present

## 2017-07-31 ENCOUNTER — Telehealth: Payer: Self-pay | Admitting: Interventional Cardiology

## 2017-07-31 DIAGNOSIS — I712 Thoracic aortic aneurysm, without rupture, unspecified: Secondary | ICD-10-CM

## 2017-07-31 NOTE — Telephone Encounter (Signed)
Pt had CT Angio of Chest in 2017 and 2018 for Aortic Thoracic Dilation.  Do you want another one this year?

## 2017-07-31 NOTE — Telephone Encounter (Signed)
New Message   Pt states Dr. Tamala Julian wants him to have a yearly CT but no orders in epic. Please call

## 2017-08-02 NOTE — Telephone Encounter (Signed)
Recommend echocardiogram to assess aorta.

## 2017-08-03 NOTE — Telephone Encounter (Signed)
Spoke with pt and went over recommendations.  Pt verbalized understanding and was in agreement with this plan.

## 2017-08-15 ENCOUNTER — Ambulatory Visit (HOSPITAL_COMMUNITY): Payer: BLUE CROSS/BLUE SHIELD | Attending: Cardiology

## 2017-08-15 ENCOUNTER — Other Ambulatory Visit: Payer: Self-pay

## 2017-08-15 DIAGNOSIS — D509 Iron deficiency anemia, unspecified: Secondary | ICD-10-CM | POA: Diagnosis not present

## 2017-08-15 DIAGNOSIS — I712 Thoracic aortic aneurysm, without rupture, unspecified: Secondary | ICD-10-CM

## 2017-08-15 DIAGNOSIS — D696 Thrombocytopenia, unspecified: Secondary | ICD-10-CM | POA: Diagnosis not present

## 2017-08-15 DIAGNOSIS — E785 Hyperlipidemia, unspecified: Secondary | ICD-10-CM | POA: Diagnosis not present

## 2017-08-15 DIAGNOSIS — R079 Chest pain, unspecified: Secondary | ICD-10-CM | POA: Insufficient documentation

## 2017-08-15 DIAGNOSIS — D72819 Decreased white blood cell count, unspecified: Secondary | ICD-10-CM | POA: Insufficient documentation

## 2017-08-15 DIAGNOSIS — I422 Other hypertrophic cardiomyopathy: Secondary | ICD-10-CM | POA: Insufficient documentation

## 2017-08-16 ENCOUNTER — Encounter: Payer: Self-pay | Admitting: Interventional Cardiology

## 2017-08-16 DIAGNOSIS — I77810 Thoracic aortic ectasia: Secondary | ICD-10-CM | POA: Insufficient documentation

## 2017-08-16 DIAGNOSIS — I517 Cardiomegaly: Secondary | ICD-10-CM | POA: Insufficient documentation

## 2017-08-21 DIAGNOSIS — K754 Autoimmune hepatitis: Secondary | ICD-10-CM | POA: Diagnosis not present

## 2017-08-23 DIAGNOSIS — M4802 Spinal stenosis, cervical region: Secondary | ICD-10-CM | POA: Diagnosis not present

## 2017-08-23 DIAGNOSIS — Z981 Arthrodesis status: Secondary | ICD-10-CM | POA: Diagnosis not present

## 2017-08-23 DIAGNOSIS — M4186 Other forms of scoliosis, lumbar region: Secondary | ICD-10-CM | POA: Diagnosis not present

## 2017-08-23 DIAGNOSIS — Z9889 Other specified postprocedural states: Secondary | ICD-10-CM | POA: Diagnosis not present

## 2017-08-26 NOTE — H&P (View-Only) (Signed)
Cardiology Office Note    Date:  08/27/2017   ID:  Corey, Shannon [birth year], MRN [id number]  PCP:  [name], MD  Cardiologist: Sinclair Grooms, MD   Chief Complaint  Patient presents with  . Thoracic Aortic Aneurysm  . Follow-up    LV hypertrophy    History of Present Illness:  Corey Shannon is a 67 y.o. male  with h/o CP, syncope, and hyperlipidemia.  Coincidentally identified aortic root enlargement (4.2 cm), coronary calcification, and severe left ventricular hypertrophy.  2D echocardiography was done 08/15/2017 to follow-up ascending aortic root size previously identified to be 4.2 cm by CT scan in 2018.  Study identified concentric left ventricular hypertrophy with free wall and septal thickness approximately 17 mm and concern of apical hypertrophy.  Over the past 6 to 12 months the patient has noted increasing dyspnea on exertion.  Having difficulty mowing his grass without dyspnea.  Was identified to have asymptomatic coronary calcification on CT in 2018.  He specifically denies episodes of chest pain.     Past Medical History:  Diagnosis Date  . Arthritis   . Autoimmune hepatitis ([place]) dx 2005   secondary to gluten allergy  . GERD (gastroesophageal reflux disease)   . Gluten intolerance   . History of nuclear stress test    Myoview 1/17: EF 61%, inferior and inferolateral ischemia, low risk  . Hyperlipidemia   . Pneumonia     Past Surgical History:  Procedure Laterality Date  . BACK SURGERY    . CHOLECYSTECTOMY    . ESOPHAGEAL BANDING N/A 11/29/2012   Procedure: ESOPHAGEAL BANDING;  Surgeon: Beryle Beams, MD;  Location: [facility];  Service: Endoscopy;  Laterality: N/A;  . ESOPHAGEAL BANDING N/A 01/24/2013   Procedure: ESOPHAGEAL BANDING;  Surgeon: Beryle Beams, MD;  Location: [facility];  Service: Endoscopy;  Laterality: N/A;  . ESOPHAGEAL BANDING N/A 02/21/2013   Procedure: ESOPHAGEAL BANDING;  Surgeon: Beryle Beams, MD;  [facility];  Service: Endoscopy;  Laterality: N/A;  . ESOPHAGEAL BANDING N/A 03/20/2014   Procedure: ESOPHAGEAL BANDING;  Surgeon: Beryle Beams, MD;  [facility];  Service: Endoscopy;  Laterality: N/A;  . ESOPHAGOGASTRODUODENOSCOPY N/A 11/29/2012   Procedure: ESOPHAGOGASTRODUODENOSCOPY (EGD);  Surgeon: Beryle Beams, MD;  Location: [facility];  Service: Endoscopy;  Laterality: N/A;  . ESOPHAGOGASTRODUODENOSCOPY N/A 01/24/2013   Procedure: ESOPHAGOGASTRODUODENOSCOPY (EGD);  Surgeon: Beryle Beams, MD;  Location: [facility];  Service: Endoscopy;  Laterality: N/A;  . ESOPHAGOGASTRODUODENOSCOPY N/A 02/21/2013   Procedure: ESOPHAGOGASTRODUODENOSCOPY (EGD);  Surgeon: Beryle Beams, MD;  Location: [facility];  Service: Endoscopy;  Laterality: N/A;  . ESOPHAGOGASTRODUODENOSCOPY (EGD) WITH PROPOFOL N/A 03/20/2014   Procedure: ESOPHAGOGASTRODUODENOSCOPY (EGD) WITH PROPOFOL;  Surgeon: Beryle Beams, MD;  [facility];  Service: Endoscopy;  Laterality: N/A;  . SPINE SURGERY     x 4, 9 disc fusion    Current Medications: Outpatient Medications Prior to Visit  Medication Sig Dispense Refill  . alfuzosin (UROXATRAL) 10 MG 24 hr tablet Take 10 mg by mouth daily with breakfast.    . atorvastatin (LIPITOR) 20 MG tablet Take 20 mg by mouth daily.    Marland Kitchen azaTHIOprine (IMURAN) 50 MG tablet Take 100 mg by mouth daily with breakfast.     . Calcium Carb-Cholecalciferol (CALCIUM 1000 + D) 1000-800 MG-UNIT TABS Take 1 tablet by mouth daily.    Marland Kitchen esomeprazole (NEXIUM) 40 MG capsule Take 40 mg by mouth daily before  breakfast.    . fish oil-omega-3 fatty acids 1000 MG capsule Take 2 g by mouth daily.    . nadolol (CORGARD) 40 MG tablet Take 40 mg by mouth daily with breakfast.     . tadalafil (CIALIS) 5 MG tablet Take 5 mg by mouth daily as needed for erectile dysfunction.    Marland Kitchen alfuzosin (UROXATRAL) 10 MG 24 hr tablet TAKE 1 TABLET DAILY.    Marland Kitchen aspirin 81 MG tablet Take 81 mg by mouth daily.      No facility-administered medications prior to visit.      Allergies:   Latex; Shellfish allergy; Gluten; and Sulfa antibiotics   Social History   Socioeconomic History  . Marital status: Married    Spouse name: Not on file  . Number of children: Not on file  . Years of education: Not on file  . Highest education level: Not on file  Occupational History  . Not on file  Social Needs  . Financial resource strain: Not on file  . Food insecurity:    Worry: Not on file    Inability: Not on file  . Transportation needs:    Medical: Not on file    Non-medical: Not on file  Tobacco Use  . Smoking status: Never Smoker  . Smokeless tobacco: Never Used  Substance and Sexual Activity  . Alcohol use: Yes    Comment: wine weekly-3 glasses  . Drug use: No  . Sexual activity: Not on file  Lifestyle  . Physical activity:    Days per week: Not on file    Minutes per session: Not on file  . Stress: Not on file  Relationships  . Social connections:    Talks on phone: Not on file    Gets together: Not on file    Attends religious service: Not on file    Active member of club or organization: Not on file    Attends meetings of clubs or organizations: Not on file    Relationship status: Not on file  Other Topics Concern  . Not on file  Social History Narrative  . Not on file     Family History:  The patient's family history includes Alzheimer's disease in his father; Heart attack in his father; Heart disease in his father; Osteoporosis in his mother; Stroke in his father.   ROS:   Please see the history of present illness.    Back and neck discomfort due to multiple prior surgeries.  Shortness of breath with activity.  Has some difficulty sleeping because of back discomfort.  Has gluten allergy, portal hypertension, autoimmune hepatitis.  No history of hypertension.  Mother had history of heart murmur. All other systems reviewed and are negative.   PHYSICAL EXAM:   VS:  BP  110/66   Pulse (!) 51   Ht 6' 4.5" (1.943 m)   Wt 268 lb 9.6 oz (121.8 kg)   BMI 32.27 kg/m    GEN: Well nourished, well developed, in no acute distress  HEENT: normal  Neck: no JVD, carotid bruits, or masses Cardiac: RRR; no murmurs, rubs, or gallops,no edema  Respiratory:  clear to auscultation bilaterally, normal work of breathing GI: soft, nontender, nondistended, + BS MS: no deformity or atrophy  Skin: warm and dry, no rash Neuro:  Alert and Oriented x 3, Strength and sensation are intact Psych: euthymic mood, full affect  Wt Readings from Last 3 Encounters:  08/27/17 268 lb 9.6 oz (121.8 kg)  06/19/17 272  lb (123.4 kg)  03/20/17 275 lb 6.4 oz (124.9 kg)      Studies/Labs Reviewed:   EKG:  EKG  none  Recent Labs: 06/19/2017: Hemoglobin 10.8; Platelet Count 73   Lipid Panel    Component Value Date/Time   CHOL  05/04/2009 0445    95        ATP III CLASSIFICATION:  <200     mg/dL   Desirable  200-239  mg/dL   Borderline High  >=240    mg/dL   High          TRIG 43 05/04/2009 0445   HDL 25 (L) 05/04/2009 0445   CHOLHDL 3.8 05/04/2009 0445   VLDL 9 05/04/2009 0445   LDLCALC  05/04/2009 0445    61        Total Cholesterol/HDL:CHD Risk Coronary Heart Disease Risk Table                     Men   Women  1/2 Average Risk   3.4   3.3  Average Risk       5.0   4.4  2 X Average Risk   9.6   7.1  3 X Average Risk  23.4   11.0        Use the calculated Patient Ratio above and the CHD Risk Table to determine the patient's CHD Risk.        ATP III CLASSIFICATION (LDL):  <100     mg/dL   Optimal  100-129  mg/dL   Near or Above                    Optimal  130-159  mg/dL   Borderline  160-189  mg/dL   High  >190     mg/dL   Very High    Additional studies/ records that were reviewed today include:  2D Doppler echocardiogram 6/12//2019 Study Conclusions   - Left ventricle: The cavity size was normal. There was severe   concentric hypertrophy. Systolic function  was normal. The   estimated ejection fraction was in the range of 60% to 65%. Wall   motion was normal; there were no regional wall motion   abnormalities. Left ventricular diastolic function parameters   were normal. - Aortic root: The aortic root was dilated measuring 42 mm. - Ascending aorta: The ascending aorta was mildly dilated measuring   41 mm. - Mitral valve: There was mild regurgitation. Valve area by   pressure half-time: 2.39 cm^2. - Right ventricle: The cavity size was normal. Wall thickness was   normal. Systolic function was normal. - Right atrium: The atrium was normal in size. - Tricuspid valve: There was trivial regurgitation. - Pulmonary arteries: Systolic pressure was within the normal   range. - Inferior vena cava: The vessel was normal in size.   Impressions:   - There is severe left ventricular hypertrophy with suspicion for   an apical hypertrophic cardiomyopathy. Consider additional   imaging with Definity echocontrast or cardiac MRI for further   evaluation. Mild dilatation of the aortic root and ascending   aorta measuring 41 mm.    ASSESSMENT:    1. Left ventricular hypertrophy   2. Coronary artery calcification seen on CAT scan   3. SOB (shortness of breath)   4. Dilated aortic root ([place])   5. Autoimmune hepatitis ([place])      PLAN:  In order of problems listed above:  1. Dyspnea on exertion  and the patient with asymptomatic coronary atherosclerosis raises a question of an anginal equivalent.  Lexiscan myocardial perfusion imaging will be done.  We will also check a BNP to exclude diastolic heart failure in the setting of concentric left ventricular hypertrophy and possible hypertrophic cardiomyopathy.  With the next imaging study is done to assess his aorta, we will do an MRI for both aortic size but also to assess the characteristics of the LV myocardium to exclude hypertrophic cardiomyopathy. 2. As mentioned above, concerned that dyspnea on  exertion could be an ischemic equivalent.  Lexiscan myocardial perfusion imaging will be performed. 3. With LVH, need to exclude heart failure.  BNP will be done to rule out CHF. 4. Aortic root by echo is identical to CT scan from a year ago at 4.2 cm.  May need coronary angiography as any evidence of perfusion abnormality on myocardial perfusion imaging.  Otherwise we will plan to see the patient back in 1 year to follow-up aortic size.  MRI of the aorta and heart will be performed at that time to help further characterize hypertrophy (question apical) and reassess aortic root size.  Prolonged office visit related to discussion of disease process and coordination of care.  Greater than 50% of the time was spent in counseling.  Medication Adjustments/Labs and Tests Ordered: Current medicines are reviewed at length with the patient today.  Concerns regarding medicines are outlined above.  Medication changes, Labs and Tests ordered today are listed in the Patient Instructions below. Patient Instructions  Medication Instructions:  Your physician recommends that you continue on your current medications as directed. Please refer to the Current Medication list given to you today.  Labwork: Pro BNP today  Testing/Procedures: Your physician has requested that you have a lexiscan myoview. For further information please visit [email or web address]. Please follow instruction sheet, as given.   Follow-Up: Your physician recommends that you schedule a follow-up appointment in: 1 month with Dr. Tamala Julian. (Can have 8/1 at 11:40AM)   Any Other Special Instructions Will Be Listed Below (If Applicable).     If you need a refill on your cardiac medications before your next appointment, please call your pharmacy.      Signed, Sinclair Grooms, MD  08/27/2017 5:48 PM    [facility] [place], [place], [place]  [address] Phone: [phone]; Fax: [phone]

## 2017-08-26 NOTE — Progress Notes (Signed)
Cardiology Office Note    Date:  08/27/2017   ID:  Caide, Campi [birth year], MRN [id number]  PCP:  [name], MD  Cardiologist: Sinclair Grooms, MD   Chief Complaint  Patient presents with  . Thoracic Aortic Aneurysm  . Follow-up    LV hypertrophy    History of Present Illness:  Corey Shannon is a 67 y.o. male  with h/o CP, syncope, and hyperlipidemia.  Coincidentally identified aortic root enlargement (4.2 cm), coronary calcification, and severe left ventricular hypertrophy.  2D echocardiography was done 08/15/2017 to follow-up ascending aortic root size previously identified to be 4.2 cm by CT scan in 2018.  Study identified concentric left ventricular hypertrophy with free wall and septal thickness approximately 17 mm and concern of apical hypertrophy.  Over the past 6 to 12 months the patient has noted increasing dyspnea on exertion.  Having difficulty mowing his grass without dyspnea.  Was identified to have asymptomatic coronary calcification on CT in 2018.  He specifically denies episodes of chest pain.     Past Medical History:  Diagnosis Date  . Arthritis   . Autoimmune hepatitis ([place]) dx 2005   secondary to gluten allergy  . GERD (gastroesophageal reflux disease)   . Gluten intolerance   . History of nuclear stress test    Myoview 1/17: EF 61%, inferior and inferolateral ischemia, low risk  . Hyperlipidemia   . Pneumonia     Past Surgical History:  Procedure Laterality Date  . BACK SURGERY    . CHOLECYSTECTOMY    . ESOPHAGEAL BANDING N/A 11/29/2012   Procedure: ESOPHAGEAL BANDING;  Surgeon: Beryle Beams, MD;  Location: [facility];  Service: Endoscopy;  Laterality: N/A;  . ESOPHAGEAL BANDING N/A 01/24/2013   Procedure: ESOPHAGEAL BANDING;  Surgeon: Beryle Beams, MD;  [facility]: [facility];  Service: Endoscopy;  Laterality: N/A;  . ESOPHAGEAL BANDING N/A 02/21/2013   Procedure: ESOPHAGEAL BANDING;  Surgeon: Beryle Beams, MD;  [facility];  Service: Endoscopy;  Laterality: N/A;  . ESOPHAGEAL BANDING N/A 03/20/2014   Procedure: ESOPHAGEAL BANDING;  Surgeon: Beryle Beams, MD;  [facility];  Service: Endoscopy;  Laterality: N/A;  . ESOPHAGOGASTRODUODENOSCOPY N/A 11/29/2012   Procedure: ESOPHAGOGASTRODUODENOSCOPY (EGD);  Surgeon: Beryle Beams, MD;  Location: [facility];  Service: Endoscopy;  Laterality: N/A;  . ESOPHAGOGASTRODUODENOSCOPY N/A 01/24/2013   Procedure: ESOPHAGOGASTRODUODENOSCOPY (EGD);  Surgeon: Beryle Beams, MD;  Location: [facility];  Service: Endoscopy;  Laterality: N/A;  . ESOPHAGOGASTRODUODENOSCOPY N/A 02/21/2013   Procedure: ESOPHAGOGASTRODUODENOSCOPY (EGD);  Surgeon: Beryle Beams, MD;  Location: [facility];  Service: Endoscopy;  Laterality: N/A;  . ESOPHAGOGASTRODUODENOSCOPY (EGD) WITH PROPOFOL N/A 03/20/2014   Procedure: ESOPHAGOGASTRODUODENOSCOPY (EGD) WITH PROPOFOL;  Surgeon: Beryle Beams, MD;  [facility]: [facility];  Service: Endoscopy;  Laterality: N/A;  . SPINE SURGERY     x 4, 9 disc fusion    Current Medications: Outpatient Medications Prior to Visit  Medication Sig Dispense Refill  . alfuzosin (UROXATRAL) 10 MG 24 hr tablet Take 10 mg by mouth daily with breakfast.    . atorvastatin (LIPITOR) 20 MG tablet Take 20 mg by mouth daily.    Marland Kitchen azaTHIOprine (IMURAN) 50 MG tablet Take 100 mg by mouth daily with breakfast.     . Calcium Carb-Cholecalciferol (CALCIUM 1000 + D) 1000-800 MG-UNIT TABS Take 1 tablet by mouth daily.    Marland Kitchen esomeprazole (NEXIUM) 40 MG capsule Take 40 mg by mouth daily before  breakfast.    . fish oil-omega-3 fatty acids 1000 MG capsule Take 2 g by mouth daily.    . nadolol (CORGARD) 40 MG tablet Take 40 mg by mouth daily with breakfast.     . tadalafil (CIALIS) 5 MG tablet Take 5 mg by mouth daily as needed for erectile dysfunction.    Marland Kitchen alfuzosin (UROXATRAL) 10 MG 24 hr tablet TAKE 1 TABLET DAILY.    Marland Kitchen aspirin 81 MG tablet Take 81 mg by mouth daily.      No facility-administered medications prior to visit.      Allergies:   Latex; Shellfish allergy; Gluten; and Sulfa antibiotics   Social History   Socioeconomic History  . Marital status: Married    Spouse name: Not on file  . Number of children: Not on file  . Years of education: Not on file  . Highest education level: Not on file  Occupational History  . Not on file  Social Needs  . Financial resource strain: Not on file  . Food insecurity:    Worry: Not on file    Inability: Not on file  . Transportation needs:    Medical: Not on file    Non-medical: Not on file  Tobacco Use  . Smoking status: Never Smoker  . Smokeless tobacco: Never Used  Substance and Sexual Activity  . Alcohol use: Yes    Comment: wine weekly-3 glasses  . Drug use: No  . Sexual activity: Not on file  Lifestyle  . Physical activity:    Days per week: Not on file    Minutes per session: Not on file  . Stress: Not on file  Relationships  . Social connections:    Talks on phone: Not on file    Gets together: Not on file    Attends religious service: Not on file    Active member of club or organization: Not on file    Attends meetings of clubs or organizations: Not on file    Relationship status: Not on file  Other Topics Concern  . Not on file  Social History Narrative  . Not on file     Family History:  The patient's family history includes Alzheimer's disease in his father; Heart attack in his father; Heart disease in his father; Osteoporosis in his mother; Stroke in his father.   ROS:   Please see the history of present illness.    Back and neck discomfort due to multiple prior surgeries.  Shortness of breath with activity.  Has some difficulty sleeping because of back discomfort.  Has gluten allergy, portal hypertension, autoimmune hepatitis.  No history of hypertension.  Mother had history of heart murmur. All other systems reviewed and are negative.   PHYSICAL EXAM:   VS:  BP  110/66   Pulse (!) 51   Ht 6' 4.5" (1.943 m)   Wt 268 lb 9.6 oz (121.8 kg)   BMI 32.27 kg/m    GEN: Well nourished, well developed, in no acute distress  HEENT: normal  Neck: no JVD, carotid bruits, or masses Cardiac: RRR; no murmurs, rubs, or gallops,no edema  Respiratory:  clear to auscultation bilaterally, normal work of breathing GI: soft, nontender, nondistended, + BS MS: no deformity or atrophy  Skin: warm and dry, no rash Neuro:  Alert and Oriented x 3, Strength and sensation are intact Psych: euthymic mood, full affect  Wt Readings from Last 3 Encounters:  08/27/17 268 lb 9.6 oz (121.8 kg)  06/19/17 272  lb (123.4 kg)  03/20/17 275 lb 6.4 oz (124.9 kg)      Studies/Labs Reviewed:   EKG:  EKG  none  Recent Labs: 06/19/2017: Hemoglobin 10.8; Platelet Count 73   Lipid Panel    Component Value Date/Time   CHOL  05/04/2009 0445    95        ATP III CLASSIFICATION:  <200     mg/dL   Desirable  200-239  mg/dL   Borderline High  >=240    mg/dL   High          TRIG 43 05/04/2009 0445   HDL 25 (L) 05/04/2009 0445   CHOLHDL 3.8 05/04/2009 0445   VLDL 9 05/04/2009 0445   LDLCALC  05/04/2009 0445    61        Total Cholesterol/HDL:CHD Risk Coronary Heart Disease Risk Table                     Men   Women  1/2 Average Risk   3.4   3.3  Average Risk       5.0   4.4  2 X Average Risk   9.6   7.1  3 X Average Risk  23.4   11.0        Use the calculated Patient Ratio above and the CHD Risk Table to determine the patient's CHD Risk.        ATP III CLASSIFICATION (LDL):  <100     mg/dL   Optimal  100-129  mg/dL   Near or Above                    Optimal  130-159  mg/dL   Borderline  160-189  mg/dL   High  >190     mg/dL   Very High    Additional studies/ records that were reviewed today include:  2D Doppler echocardiogram 6/12//2019 Study Conclusions   - Left ventricle: The cavity size was normal. There was severe   concentric hypertrophy. Systolic function  was normal. The   estimated ejection fraction was in the range of 60% to 65%. Wall   motion was normal; there were no regional wall motion   abnormalities. Left ventricular diastolic function parameters   were normal. - Aortic root: The aortic root was dilated measuring 42 mm. - Ascending aorta: The ascending aorta was mildly dilated measuring   41 mm. - Mitral valve: There was mild regurgitation. Valve area by   pressure half-time: 2.39 cm^2. - Right ventricle: The cavity size was normal. Wall thickness was   normal. Systolic function was normal. - Right atrium: The atrium was normal in size. - Tricuspid valve: There was trivial regurgitation. - Pulmonary arteries: Systolic pressure was within the normal   range. - Inferior vena cava: The vessel was normal in size.   Impressions:   - There is severe left ventricular hypertrophy with suspicion for   an apical hypertrophic cardiomyopathy. Consider additional   imaging with Definity echocontrast or cardiac MRI for further   evaluation. Mild dilatation of the aortic root and ascending   aorta measuring 41 mm.    ASSESSMENT:    1. Left ventricular hypertrophy   2. Coronary artery calcification seen on CAT scan   3. SOB (shortness of breath)   4. Dilated aortic root ([place])   5. Autoimmune hepatitis ([place])      PLAN:  In order of problems listed above:  1. Dyspnea on exertion  and the patient with asymptomatic coronary atherosclerosis raises a question of an anginal equivalent.  Lexiscan myocardial perfusion imaging will be done.  We will also check a BNP to exclude diastolic heart failure in the setting of concentric left ventricular hypertrophy and possible hypertrophic cardiomyopathy.  With the next imaging study is done to assess his aorta, we will do an MRI for both aortic size but also to assess the characteristics of the LV myocardium to exclude hypertrophic cardiomyopathy. 2. As mentioned above, concerned that dyspnea on  exertion could be an ischemic equivalent.  Lexiscan myocardial perfusion imaging will be performed. 3. With LVH, need to exclude heart failure.  BNP will be done to rule out CHF. 4. Aortic root by echo is identical to CT scan from a year ago at 4.2 cm.  May need coronary angiography as any evidence of perfusion abnormality on myocardial perfusion imaging.  Otherwise we will plan to see the patient back in 1 year to follow-up aortic size.  MRI of the aorta and heart will be performed at that time to help further characterize hypertrophy (question apical) and reassess aortic root size.  Prolonged office visit related to discussion of disease process and coordination of care.  Greater than 50% of the time was spent in counseling.  Medication Adjustments/Labs and Tests Ordered: Current medicines are reviewed at length with the patient today.  Concerns regarding medicines are outlined above.  Medication changes, Labs and Tests ordered today are listed in the Patient Instructions below. Patient Instructions  Medication Instructions:  Your physician recommends that you continue on your current medications as directed. Please refer to the Current Medication list given to you today.  Labwork: Pro BNP today  Testing/Procedures: Your physician has requested that you have a lexiscan myoview. For further information please visit [email or web address]. Please follow instruction sheet, as given.   Follow-Up: Your physician recommends that you schedule a follow-up appointment in: 1 month with Dr. Tamala Julian. (Can have 8/1 at 11:40AM)   Any Other Special Instructions Will Be Listed Below (If Applicable).     If you need a refill on your cardiac medications before your next appointment, please call your pharmacy.      Signed, Sinclair Grooms, MD  08/27/2017 5:48 PM    [facility] [place], [place], [place]  [address] Phone: [phone]; Fax: [phone]

## 2017-08-27 ENCOUNTER — Encounter: Payer: Self-pay | Admitting: *Deleted

## 2017-08-27 ENCOUNTER — Encounter: Payer: Self-pay | Admitting: Interventional Cardiology

## 2017-08-27 ENCOUNTER — Ambulatory Visit: Payer: BLUE CROSS/BLUE SHIELD | Admitting: Interventional Cardiology

## 2017-08-27 VITALS — BP 110/66 | HR 51 | Ht 76.5 in | Wt 268.6 lb

## 2017-08-27 DIAGNOSIS — I517 Cardiomegaly: Secondary | ICD-10-CM

## 2017-08-27 DIAGNOSIS — I2510 Atherosclerotic heart disease of native coronary artery without angina pectoris: Secondary | ICD-10-CM | POA: Diagnosis not present

## 2017-08-27 DIAGNOSIS — K754 Autoimmune hepatitis: Secondary | ICD-10-CM

## 2017-08-27 DIAGNOSIS — I77810 Thoracic aortic ectasia: Secondary | ICD-10-CM | POA: Diagnosis not present

## 2017-08-27 DIAGNOSIS — R0602 Shortness of breath: Secondary | ICD-10-CM

## 2017-08-27 NOTE — Patient Instructions (Addendum)
Medication Instructions:  Your physician recommends that you continue on your current medications as directed. Please refer to the Current Medication list given to you today.  Labwork: Pro BNP today  Testing/Procedures: Your physician has requested that you have a lexiscan myoview. For further information please visit [email or web address]. Please follow instruction sheet, as given.   Follow-Up: Your physician recommends that you schedule a follow-up appointment in: 1 month with Dr. Tamala Julian. (Can have 8/1 at 11:40AM)   Any Other Special Instructions Will Be Listed Below (If Applicable).     If you need a refill on your cardiac medications before your next appointment, please call your pharmacy.

## 2017-08-28 LAB — PRO B NATRIURETIC PEPTIDE: NT-Pro BNP: 135 pg/mL (ref 0–210)

## 2017-08-29 ENCOUNTER — Telehealth: Payer: Self-pay | Admitting: Interventional Cardiology

## 2017-08-29 ENCOUNTER — Telehealth (HOSPITAL_COMMUNITY): Payer: Self-pay | Admitting: *Deleted

## 2017-08-29 NOTE — Telephone Encounter (Signed)
Informed pt of results. Pt verbalized understanding.

## 2017-08-29 NOTE — Telephone Encounter (Signed)
Patient given detailed instructions per Myocardial Perfusion Study Information Sheet for the test on 09/04/17. Patient notified to arrive 15 minutes early and that it is imperative to arrive on time for appointment to keep from having the test rescheduled.  If you need to cancel or reschedule your appointment, please call the office within 24 hours of your appointment. . Patient verbalized understanding. Kirstie Peri

## 2017-08-29 NOTE — Telephone Encounter (Signed)
New Message:        Pt states he got some results today and has some questions.

## 2017-08-30 DIAGNOSIS — K449 Diaphragmatic hernia without obstruction or gangrene: Secondary | ICD-10-CM | POA: Diagnosis not present

## 2017-08-30 DIAGNOSIS — K7460 Unspecified cirrhosis of liver: Secondary | ICD-10-CM | POA: Diagnosis not present

## 2017-08-30 DIAGNOSIS — K6289 Other specified diseases of anus and rectum: Secondary | ICD-10-CM | POA: Diagnosis not present

## 2017-08-30 DIAGNOSIS — I8500 Esophageal varices without bleeding: Secondary | ICD-10-CM | POA: Diagnosis not present

## 2017-08-30 DIAGNOSIS — I864 Gastric varices: Secondary | ICD-10-CM | POA: Diagnosis not present

## 2017-08-30 DIAGNOSIS — Z8719 Personal history of other diseases of the digestive system: Secondary | ICD-10-CM | POA: Diagnosis not present

## 2017-08-30 DIAGNOSIS — Z86010 Personal history of colonic polyps: Secondary | ICD-10-CM | POA: Diagnosis not present

## 2017-08-30 DIAGNOSIS — K5190 Ulcerative colitis, unspecified, without complications: Secondary | ICD-10-CM | POA: Diagnosis not present

## 2017-08-30 DIAGNOSIS — K208 Other esophagitis: Secondary | ICD-10-CM | POA: Diagnosis not present

## 2017-08-30 DIAGNOSIS — D124 Benign neoplasm of descending colon: Secondary | ICD-10-CM | POA: Diagnosis not present

## 2017-08-30 DIAGNOSIS — K317 Polyp of stomach and duodenum: Secondary | ICD-10-CM | POA: Diagnosis not present

## 2017-08-30 DIAGNOSIS — K644 Residual hemorrhoidal skin tags: Secondary | ICD-10-CM | POA: Diagnosis not present

## 2017-08-30 DIAGNOSIS — K648 Other hemorrhoids: Secondary | ICD-10-CM | POA: Diagnosis not present

## 2017-08-30 DIAGNOSIS — R1310 Dysphagia, unspecified: Secondary | ICD-10-CM | POA: Diagnosis not present

## 2017-08-30 DIAGNOSIS — Z1211 Encounter for screening for malignant neoplasm of colon: Secondary | ICD-10-CM | POA: Diagnosis not present

## 2017-08-30 DIAGNOSIS — K635 Polyp of colon: Secondary | ICD-10-CM | POA: Diagnosis not present

## 2017-08-30 DIAGNOSIS — K3189 Other diseases of stomach and duodenum: Secondary | ICD-10-CM | POA: Diagnosis not present

## 2017-08-31 DIAGNOSIS — D126 Benign neoplasm of colon, unspecified: Secondary | ICD-10-CM | POA: Diagnosis not present

## 2017-09-03 DIAGNOSIS — Z8709 Personal history of other diseases of the respiratory system: Secondary | ICD-10-CM | POA: Diagnosis not present

## 2017-09-03 DIAGNOSIS — H9313 Tinnitus, bilateral: Secondary | ICD-10-CM | POA: Diagnosis not present

## 2017-09-03 DIAGNOSIS — H903 Sensorineural hearing loss, bilateral: Secondary | ICD-10-CM | POA: Diagnosis not present

## 2017-09-04 ENCOUNTER — Telehealth: Payer: Self-pay | Admitting: *Deleted

## 2017-09-04 ENCOUNTER — Ambulatory Visit (HOSPITAL_COMMUNITY): Payer: BLUE CROSS/BLUE SHIELD | Attending: Internal Medicine

## 2017-09-04 ENCOUNTER — Encounter: Payer: Self-pay | Admitting: *Deleted

## 2017-09-04 DIAGNOSIS — I2510 Atherosclerotic heart disease of native coronary artery without angina pectoris: Secondary | ICD-10-CM | POA: Diagnosis not present

## 2017-09-04 DIAGNOSIS — R0602 Shortness of breath: Secondary | ICD-10-CM

## 2017-09-04 DIAGNOSIS — Z8249 Family history of ischemic heart disease and other diseases of the circulatory system: Secondary | ICD-10-CM | POA: Diagnosis not present

## 2017-09-04 DIAGNOSIS — R9439 Abnormal result of other cardiovascular function study: Secondary | ICD-10-CM

## 2017-09-04 LAB — MYOCARDIAL PERFUSION IMAGING
LV dias vol: 172 mL (ref 62–150)
LV sys vol: 77 mL
Peak HR: 70 {beats}/min
RATE: 0.32
Rest HR: 54 {beats}/min
SDS: 2
SRS: 5
SSS: 7
TID: 0.98

## 2017-09-04 MED ORDER — REGADENOSON 0.4 MG/5ML IV SOLN
0.4000 mg | Freq: Once | INTRAVENOUS | Status: AC
  Administered 2017-09-04: 0.4 mg via INTRAVENOUS

## 2017-09-04 MED ORDER — TECHNETIUM TC 99M TETROFOSMIN IV KIT
32.5000 | PACK | Freq: Once | INTRAVENOUS | Status: AC | PRN
  Administered 2017-09-04: 32.5 via INTRAVENOUS
  Filled 2017-09-04: qty 33

## 2017-09-04 MED ORDER — TECHNETIUM TC 99M TETROFOSMIN IV KIT
8.8000 | PACK | Freq: Once | INTRAVENOUS | Status: AC | PRN
  Administered 2017-09-04: 8.8 via INTRAVENOUS
  Filled 2017-09-04: qty 9

## 2017-09-04 NOTE — Telephone Encounter (Signed)
Spoke with pt and went over results and recommendations per Dr. Tamala Julian. Pt agreeable to have heart cath. Scheduled pt for cath on 7/11. Pt will come for labs on 7/10. Will contact pt with instructions.  Left message for pt to call back and go over instructions for cath.

## 2017-09-04 NOTE — Telephone Encounter (Signed)
Spoke with pt and went over cath instructions.  Pt will come for labs on 7/10.  Pt verbalized understanding and was in agreement with this plan.  Pt appreciative for call.

## 2017-09-04 NOTE — Telephone Encounter (Signed)
Pt calling and returning your call concerning instructions for his sx tomorrow

## 2017-09-04 NOTE — Telephone Encounter (Signed)
-----   Message from Belva Crome, MD sent at 09/04/2017  2:58 PM EDT ----- Let the patient know blood work and stress evaluation has been reviewed.  The stress study is similar to 2 years ago.  Not totally normal but felt to represent low risk.  Possible lateral wall ischemia.  Should probably have coronary angiography performed to be certain that coronary disease is not causing shortness of breath.  Let me know if I should call and speak to him about the recommendation. A copy will be sent to [name], MD

## 2017-09-05 ENCOUNTER — Encounter: Payer: Self-pay | Admitting: Interventional Cardiology

## 2017-09-09 ENCOUNTER — Encounter: Payer: Self-pay | Admitting: Interventional Cardiology

## 2017-09-11 ENCOUNTER — Other Ambulatory Visit: Payer: BLUE CROSS/BLUE SHIELD

## 2017-09-11 ENCOUNTER — Telehealth: Payer: Self-pay | Admitting: *Deleted

## 2017-09-11 DIAGNOSIS — R9439 Abnormal result of other cardiovascular function study: Secondary | ICD-10-CM | POA: Diagnosis not present

## 2017-09-11 DIAGNOSIS — R0602 Shortness of breath: Secondary | ICD-10-CM | POA: Diagnosis not present

## 2017-09-11 NOTE — Telephone Encounter (Addendum)
Catheterization scheduled at [facility] for: Thursday July 11,2019 10:30 AM Verify arrival time and place: [facility] En[facility]ance A at: 8 AM  No solid food after midnight prior to cath, clear liquids until 5 AM day of procedure. Verify allergies in Epic Verify no diabetes medications.  Hold: Cialis 24 hours pre procedure  AM meds can be  taken pre-cath with sip of water including: ASA 81 mg  Confirm patient has responsible person to drive home post procedure and for 24 hours after you arrive home yes  LMTCB to discuss instructions with patient, ask patient if he can come for lab today instead of tomorrow.  I discussed instructions with patient, he verbalized understanding, will plan to come this afternoon for his pre-procedure lab.  I also discussed shellfish allergy with patient, pt denies allergic reaction to contrast in the past , states he has never been pre-medicated with prednisone prior to testing  using contrast.

## 2017-09-12 ENCOUNTER — Other Ambulatory Visit: Payer: BLUE CROSS/BLUE SHIELD

## 2017-09-12 LAB — CBC
Hematocrit: 33.2 % — ABNORMAL LOW (ref 37.5–51.0)
Hemoglobin: 10.4 g/dL — ABNORMAL LOW (ref 13.0–17.7)
MCH: 28.9 pg (ref 26.6–33.0)
MCHC: 31.3 g/dL — ABNORMAL LOW (ref 31.5–35.7)
MCV: 92 fL (ref 79–97)
Platelets: 81 10*3/uL — CL (ref 150–450)
RBC: 3.6 x10E6/uL — ABNORMAL LOW (ref 4.14–5.80)
RDW: 20.5 % — ABNORMAL HIGH (ref 12.3–15.4)
WBC: 1.6 10*3/uL — CL (ref 3.4–10.8)

## 2017-09-12 LAB — BASIC METABOLIC PANEL
BUN/Creatinine Ratio: 10 (ref 10–24)
BUN: 8 mg/dL (ref 8–27)
CO2: 24 mmol/L (ref 20–29)
Calcium: 7.8 mg/dL — ABNORMAL LOW (ref 8.6–10.2)
Chloride: 106 mmol/L (ref 96–106)
Creatinine, Ser: 0.82 mg/dL (ref 0.76–1.27)
GFR calc Af Amer: 108 mL/min/{1.73_m2} (ref >59)
GFR calc non Af Amer: 93 mL/min/{1.73_m2} (ref >59)
Glucose: 81 mg/dL (ref 65–99)
Potassium: 4.2 mmol/L (ref 3.5–5.2)
Sodium: 139 mmol/L (ref 134–144)

## 2017-09-12 LAB — PROTIME-INR
INR: 1.4 — ABNORMAL HIGH (ref 0.8–1.2)
Prothrombin Time: 13.9 s — ABNORMAL HIGH (ref 9.1–12.0)

## 2017-09-12 NOTE — Telephone Encounter (Signed)
I have reviewed BMP/CBC results done 09/11/17 with Dr Tamala Julian.

## 2017-09-13 ENCOUNTER — Encounter (HOSPITAL_COMMUNITY): Payer: Self-pay | Admitting: Interventional Cardiology

## 2017-09-13 ENCOUNTER — Ambulatory Visit (HOSPITAL_COMMUNITY)
Admission: RE | Disposition: A | Discharge status: Home / Self Care | Payer: Self-pay | Source: Ambulatory Visit | Attending: Interventional Cardiology

## 2017-09-13 ENCOUNTER — Other Ambulatory Visit: Payer: Self-pay

## 2017-09-13 ENCOUNTER — Ambulatory Visit (HOSPITAL_COMMUNITY)
Admission: RE | Admit: 2017-09-13 | Discharge: 2017-09-13 | Disposition: A | Discharge status: Home / Self Care | Payer: BLUE CROSS/BLUE SHIELD | Source: Ambulatory Visit | Attending: Interventional Cardiology | Admitting: Interventional Cardiology

## 2017-09-13 DIAGNOSIS — K754 Autoimmune hepatitis: Secondary | ICD-10-CM | POA: Insufficient documentation

## 2017-09-13 DIAGNOSIS — I422 Other hypertrophic cardiomyopathy: Secondary | ICD-10-CM | POA: Insufficient documentation

## 2017-09-13 DIAGNOSIS — Z91040 Latex allergy status: Secondary | ICD-10-CM | POA: Diagnosis not present

## 2017-09-13 DIAGNOSIS — Z7982 Long term (current) use of aspirin: Secondary | ICD-10-CM | POA: Diagnosis not present

## 2017-09-13 DIAGNOSIS — E785 Hyperlipidemia, unspecified: Secondary | ICD-10-CM | POA: Insufficient documentation

## 2017-09-13 DIAGNOSIS — Z91018 Allergy to other foods: Secondary | ICD-10-CM | POA: Diagnosis not present

## 2017-09-13 DIAGNOSIS — I712 Thoracic aortic aneurysm, without rupture: Secondary | ICD-10-CM | POA: Diagnosis not present

## 2017-09-13 DIAGNOSIS — K219 Gastro-esophageal reflux disease without esophagitis: Secondary | ICD-10-CM | POA: Diagnosis not present

## 2017-09-13 DIAGNOSIS — Z79899 Other long term (current) drug therapy: Secondary | ICD-10-CM | POA: Diagnosis not present

## 2017-09-13 DIAGNOSIS — Z823 Family history of stroke: Secondary | ICD-10-CM | POA: Insufficient documentation

## 2017-09-13 DIAGNOSIS — Z91013 Allergy to seafood: Secondary | ICD-10-CM | POA: Diagnosis not present

## 2017-09-13 DIAGNOSIS — M1990 Unspecified osteoarthritis, unspecified site: Secondary | ICD-10-CM | POA: Insufficient documentation

## 2017-09-13 DIAGNOSIS — Z9889 Other specified postprocedural states: Secondary | ICD-10-CM | POA: Insufficient documentation

## 2017-09-13 DIAGNOSIS — Z882 Allergy status to sulfonamides status: Secondary | ICD-10-CM | POA: Diagnosis not present

## 2017-09-13 DIAGNOSIS — R0602 Shortness of breath: Secondary | ICD-10-CM | POA: Diagnosis not present

## 2017-09-13 DIAGNOSIS — Z8262 Family history of osteoporosis: Secondary | ICD-10-CM | POA: Diagnosis not present

## 2017-09-13 DIAGNOSIS — Z8249 Family history of ischemic heart disease and other diseases of the circulatory system: Secondary | ICD-10-CM | POA: Insufficient documentation

## 2017-09-13 DIAGNOSIS — Z981 Arthrodesis status: Secondary | ICD-10-CM | POA: Diagnosis not present

## 2017-09-13 DIAGNOSIS — I2510 Atherosclerotic heart disease of native coronary artery without angina pectoris: Secondary | ICD-10-CM | POA: Insufficient documentation

## 2017-09-13 DIAGNOSIS — R079 Chest pain, unspecified: Secondary | ICD-10-CM

## 2017-09-13 DIAGNOSIS — I517 Cardiomegaly: Secondary | ICD-10-CM | POA: Diagnosis present

## 2017-09-13 DIAGNOSIS — I77810 Thoracic aortic ectasia: Secondary | ICD-10-CM | POA: Diagnosis present

## 2017-09-13 DIAGNOSIS — Z9049 Acquired absence of other specified parts of digestive tract: Secondary | ICD-10-CM | POA: Diagnosis not present

## 2017-09-13 DIAGNOSIS — R9439 Abnormal result of other cardiovascular function study: Secondary | ICD-10-CM | POA: Diagnosis not present

## 2017-09-13 HISTORY — PX: LEFT HEART CATH AND CORONARY ANGIOGRAPHY: CATH118249

## 2017-09-13 SURGERY — LEFT HEART CATH AND CORONARY ANGIOGRAPHY
Anesthesia: LOCAL

## 2017-09-13 MED ORDER — SODIUM CHLORIDE 0.9 % WEIGHT BASED INFUSION: 1.0000 mL/kg/h | INTRAVENOUS | Status: DC

## 2017-09-13 MED ORDER — MIDAZOLAM HCL 2 MG/2ML IJ SOLN
INTRAMUSCULAR | Status: DC | PRN
  Administered 2017-09-13 (×2): 1 mg via INTRAVENOUS

## 2017-09-13 MED ORDER — MIDAZOLAM HCL 2 MG/2ML IJ SOLN
INTRAMUSCULAR | Status: AC
  Filled 2017-09-13: qty 2

## 2017-09-13 MED ORDER — HEPARIN (PORCINE) IN NACL 2-0.9 UNITS/ML
INTRAMUSCULAR | Status: AC | PRN
  Administered 2017-09-13 (×2): 500 mL

## 2017-09-13 MED ORDER — SODIUM CHLORIDE 0.9% FLUSH: 3.0000 mL | Freq: Two times a day (BID) | INTRAVENOUS | Status: DC

## 2017-09-13 MED ORDER — ONDANSETRON HCL 4 MG/2ML IJ SOLN: 4.0000 mg | Freq: Four times a day (QID) | INTRAMUSCULAR | Status: DC | PRN

## 2017-09-13 MED ORDER — LIDOCAINE HCL (PF) 1 % IJ SOLN
INTRAMUSCULAR | Status: DC | PRN
  Administered 2017-09-13: 2 mL

## 2017-09-13 MED ORDER — SODIUM CHLORIDE 0.9 % IV SOLN: 250.0000 mL | INTRAVENOUS | Status: DC | PRN

## 2017-09-13 MED ORDER — OXYCODONE HCL 5 MG PO TABS: 5.0000 mg | ORAL_TABLET | ORAL | Status: DC | PRN

## 2017-09-13 MED ORDER — FENTANYL CITRATE (PF) 100 MCG/2ML IJ SOLN
INTRAMUSCULAR | Status: AC
  Filled 2017-09-13: qty 2

## 2017-09-13 MED ORDER — ASPIRIN 81 MG PO CHEW: 81.0000 mg | CHEWABLE_TABLET | ORAL | Status: DC

## 2017-09-13 MED ORDER — SODIUM CHLORIDE 0.9% FLUSH: 3.0000 mL | INTRAVENOUS | Status: DC | PRN

## 2017-09-13 MED ORDER — ACETAMINOPHEN 325 MG PO TABS: 650.0000 mg | ORAL_TABLET | ORAL | Status: DC | PRN

## 2017-09-13 MED ORDER — SODIUM CHLORIDE 0.9 % WEIGHT BASED INFUSION
3.0000 mL/kg/h | INTRAVENOUS | Status: AC
  Administered 2017-09-13: 3 mL/kg/h via INTRAVENOUS

## 2017-09-13 MED ORDER — HEPARIN SODIUM (PORCINE) 1000 UNIT/ML IJ SOLN
INTRAMUSCULAR | Status: DC | PRN
  Administered 2017-09-13: 4000 [IU] via INTRAVENOUS

## 2017-09-13 MED ORDER — HEPARIN (PORCINE) IN NACL 1000-0.9 UT/500ML-% IV SOLN
INTRAVENOUS | Status: AC
  Filled 2017-09-13: qty 500

## 2017-09-13 MED ORDER — VERAPAMIL HCL 2.5 MG/ML IV SOLN
INTRAVENOUS | Status: DC | PRN
  Administered 2017-09-13: 10 mL via INTRA_ARTERIAL

## 2017-09-13 MED ORDER — SODIUM CHLORIDE 0.9 % IV SOLN: INTRAVENOUS | Status: DC

## 2017-09-13 MED ORDER — VERAPAMIL HCL 2.5 MG/ML IV SOLN
INTRAVENOUS | Status: AC
  Filled 2017-09-13: qty 2

## 2017-09-13 MED ORDER — IOHEXOL 350 MG/ML SOLN
INTRAVENOUS | Status: DC | PRN
  Administered 2017-09-13: 55 mL via INTRA_ARTERIAL

## 2017-09-13 MED ORDER — LIDOCAINE HCL (PF) 1 % IJ SOLN
INTRAMUSCULAR | Status: AC
  Filled 2017-09-13: qty 30

## 2017-09-13 MED ORDER — FENTANYL CITRATE (PF) 100 MCG/2ML IJ SOLN
INTRAMUSCULAR | Status: DC | PRN
  Administered 2017-09-13: 50 ug via INTRAVENOUS

## 2017-09-13 SURGICAL SUPPLY — 12 items
CATH INFINITI 5FR JL4 (CATHETERS) ×1 IMPLANT
CATH INFINITI JR4 5F (CATHETERS) ×1 IMPLANT
COVER PRB 48X5XTLSCP FOLD TPE (BAG) IMPLANT
COVER PROBE 5X48 (BAG) ×2
DEVICE RAD COMP TR BAND LRG (VASCULAR PRODUCTS) ×1 IMPLANT
GLIDESHEATH SLEND A-KIT 6F 22G (SHEATH) ×1 IMPLANT
GUIDEWIRE INQWIRE 1.5J.035X260 (WIRE) IMPLANT
INQWIRE 1.5J .035X260CM (WIRE) ×2
KIT HEART LEFT (KITS) ×2 IMPLANT
PACK CARDIAC CATHETERIZATION (CUSTOM PROCEDURE TRAY) ×2 IMPLANT
TRANSDUCER W/STOPCOCK (MISCELLANEOUS) ×2 IMPLANT
TUBING CIL FLEX 10 FLL-RA (TUBING) ×2 IMPLANT

## 2017-09-13 NOTE — Discharge Instructions (Signed)
Drink plenty of fluids and keep right arm elevated at or above heart level   Radial Site Care Refer to this sheet in the next few weeks. These instructions provide you with information about caring for yourself after your procedure. Your health care provider may also give you more specific instructions. Your treatment has been planned according to current medical practices, but problems sometimes occur. Call your health care provider if you have any problems or questions after your procedure. What can I expect after the procedure? After your procedure, it is typical to have the following:  Bruising at the radial site that usually fades within 1-2 weeks.  Blood collecting in the tissue (hematoma) that may be painful to the touch. It should usually decrease in size and tenderness within 1-2 weeks.  Follow these instructions at home:  Take medicines only as directed by your health care provider.  You may shower 24-48 hours after the procedure or as directed by your health care provider. Remove the bandage (dressing) and gently wash the site with plain soap and water. Pat the area dry with a clean towel. Do not rub the site, because this may cause bleeding.  Do not take baths, swim, or use a hot tub until your health care provider approves.  Check your insertion site every day for redness, swelling, or drainage.  Do not apply powder or lotion to the site.  Do not flex or bend the affected arm for 24 hours or as directed by your health care provider.  Do not push or pull heavy objects with the affected arm for 24 hours or as directed by your health care provider.  Do not lift over 10 lb (4.5 kg) for 5 days after your procedure or as directed by your health care provider.  Ask your health care provider when it is okay to: ? Return to work or school. ? Resume usual physical activities or sports. ? Resume sexual activity.  Do not drive home if you are discharged the same day as the  procedure. Have someone else drive you.  You may drive 24 hours after the procedure unless otherwise instructed by your health care provider.  Do not operate machinery or power tools for 24 hours after the procedure.  If your procedure was done as an outpatient procedure, which means that you went home the same day as your procedure, a responsible adult should be with you for the first 24 hours after you arrive home.  Keep all follow-up visits as directed by your health care provider. This is important. Contact a health care provider if:  You have a fever.  You have chills.  You have increased bleeding from the radial site. Hold pressure on the site. Get help right away if:  You have unusual pain at the radial site.  You have redness, warmth, or swelling at the radial site.  You have drainage (other than a small amount of blood on the dressing) from the radial site.  The radial site is bleeding, and the bleeding does not stop after 30 minutes of holding steady pressure on the site.  Your arm or hand becomes pale, cool, tingly, or numb. This information is not intended to replace advice given to you by your health care provider. Make sure you discuss any questions you have with your health care provider. Document Released: 03/25/2010 Document Revised: 07/29/2015 Document Reviewed: 09/08/2013 Elsevier Interactive Patient Education  2018 [employer].

## 2017-09-13 NOTE — Interval H&P Note (Signed)
Cath Lab Visit (complete for each Cath Lab visit)  Clinical Evaluation Leading to the Procedure:   ACS: No.  Non-ACS:    Anginal Classification: CCS III  Anti-ischemic medical therapy: Minimal Therapy (1 class of medications)  Non-Invasive Test Results: Low-risk stress test findings: cardiac mortality <1%/year  Prior CABG: No previous CABG      History and Physical Interval Note:  09/13/2017 11:58 AM  Corey Shannon  has presented today for surgery, with the diagnosis of abnormal stress test, sob  The various methods of treatment have been discussed with the patient and family. After consideration of risks, benefits and other options for treatment, the patient has consented to  Procedure(s): LEFT HEART CATH AND CORONARY ANGIOGRAPHY (N/A) as a surgical intervention .  The patient's history has been reviewed, patient examined, no change in status, stable for surgery.  I have reviewed the patient's chart and labs.  Questions were answered to the patient's satisfaction.     Belva Crome III

## 2017-09-13 NOTE — Research (Signed)
CADFEM Informed Consent   Subject Name: Corey Shannon  Subject met inclusion and exclusion criteria.  The informed consent form, study requirements and expectations were reviewed with the subject and questions and concerns were addressed prior to the signing of the consent form.  The subject verbalized understanding of the trail requirements.  The subject agreed to participate in the CADFEM trial and signed the informed consent.  The informed consent was obtained prior to performance of any protocol-specific procedures for the subject.  A copy of the signed informed consent was given to the subject and a copy was placed in the subject's medical record.  Hedrick,Tammy W 09/13/2017, 0900

## 2017-09-14 MED FILL — Heparin Sod (Porcine)-NaCl IV Soln 1000 Unit/500ML-0.9%: INTRAVENOUS | Qty: 1000 | Status: AC

## 2017-09-27 DIAGNOSIS — R509 Fever, unspecified: Secondary | ICD-10-CM | POA: Diagnosis not present

## 2017-09-27 DIAGNOSIS — N419 Inflammatory disease of prostate, unspecified: Secondary | ICD-10-CM | POA: Diagnosis not present

## 2017-09-27 DIAGNOSIS — R350 Frequency of micturition: Secondary | ICD-10-CM | POA: Diagnosis not present

## 2017-10-02 NOTE — Progress Notes (Signed)
Cardiology Office Note:    Date:  10/04/2017   ID:  Corey Shannon, DOB 04-Jan-1951, MRN [id number]  PCP:  [name], MD  Cardiologist:  No primary care provider on file.   Referring MD: [name], MD   Chief Complaint  Patient presents with  . Shortness of Breath    History of Present Illness:    Corey Shannon is a 67 y.o. male with a hx of CP, dyspnea on exertion, syncope, and hyperlipidemia.  Coincidentally identified aortic root enlargement (4.2 cm), coronary calcification, and severe left ventricular hypertrophy. Recent cath with widely patent coronary arteries July 2019 was performed because of exertional dyspnea and atypical chest pain.  Recent catheterization demonstrated noncritical/nonobstructive coronary artery disease, normal LV systolic function, and upper normal LV end-diastolic pressure.  The procedure was performed to exclude significant coronary disease in the setting of atypical chest pain and limiting exertional dyspnea that is new this year compared to last.  Since catheterization he has had no cath site complications or other complaints.   Past Medical History:  Diagnosis Date  . Arthritis   . 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 GERD (gastroesophageal reflux disease)   . Gluten intolerance   . History of nuclear stress test    Myoview 1/17: EF 61%, inferior and inferolateral ischemia, low risk  . Hyperlipidemia   . Pneumonia     Past Surgical History:  Procedure Laterality Date  . BACK SURGERY    . CHOLECYSTECTOMY    . ESOPHAGEAL BANDING N/A 11/29/2012   Procedure: ESOPHAGEAL BANDING;  Surgeon: Beryle Beams, MD;  Location: [facility];  Service: Endoscopy;  Laterality: N/A;  . ESOPHAGEAL BANDING N/A 01/24/2013   Procedure: ESOPHAGEAL BANDING;  Surgeon: Beryle Beams, MD;  Location: [facility];  Service: Endoscopy;  Laterality: N/A;  . ESOPHAGEAL BANDING N/A 02/21/2013   Procedure: ESOPHAGEAL BANDING;   Surgeon: Beryle Beams, MD;  Location: [facility];  Service: Endoscopy;  Laterality: N/A;  . ESOPHAGEAL BANDING N/A 03/20/2014   Procedure: ESOPHAGEAL BANDING;  Surgeon: Beryle Beams, MD;  Location: [facility];  Service: Endoscopy;  Laterality: N/A;  . ESOPHAGOGASTRODUODENOSCOPY N/A 11/29/2012   Procedure: ESOPHAGOGASTRODUODENOSCOPY (EGD);  Surgeon: Beryle Beams, MD;  Location: [facility];  Service: Endoscopy;  Laterality: N/A;  . ESOPHAGOGASTRODUODENOSCOPY N/A 01/24/2013   Procedure: ESOPHAGOGASTRODUODENOSCOPY (EGD);  Surgeon: Beryle Beams, MD;  Location: [facility];  Service: Endoscopy;  Laterality: N/A;  . ESOPHAGOGASTRODUODENOSCOPY N/A 02/21/2013   Procedure: ESOPHAGOGASTRODUODENOSCOPY (EGD);  Surgeon: Beryle Beams, MD;  Location: [facility];  Service: Endoscopy;  Laterality: N/A;  . ESOPHAGOGASTRODUODENOSCOPY (EGD) WITH PROPOFOL N/A 03/20/2014   Procedure: ESOPHAGOGASTRODUODENOSCOPY (EGD) WITH PROPOFOL;  Surgeon: Beryle Beams, MD;  Location: [facility];  Service: Endoscopy;  Laterality: N/A;  . LEFT HEART CATH AND CORONARY ANGIOGRAPHY N/A 09/13/2017   Procedure: LEFT HEART CATH AND CORONARY ANGIOGRAPHY;  Surgeon: Belva Crome, MD;  Location: [facility];  Service: Cardiovascular;  Laterality: N/A;  . SPINE SURGERY     x 4, 9 disc fusion    Current Medications: Current Meds  Medication Sig  . alfuzosin (UROXATRAL) 10 MG 24 hr tablet Take 10 mg by mouth daily with breakfast.  . atorvastatin (LIPITOR) 20 MG tablet Take 20 mg by mouth daily.  Marland Kitchen azaTHIOprine (IMURAN) 50 MG tablet Take 100 mg by mouth daily with breakfast.   . Calcium Carb-Cholecalciferol (CALCIUM 1000 + D) 1000-800 MG-UNIT TABS Take  1 tablet by mouth daily.  Marland Kitchen esomeprazole (NEXIUM) 40 MG capsule Take 40 mg by mouth daily before breakfast.  . fish oil-omega-3 fatty acids 1000 MG capsule Take 2 g by mouth daily.  . nadolol (CORGARD) 40 MG tablet Take 40 mg by mouth daily with breakfast.   .  tadalafil (CIALIS) 5 MG tablet Take 5 mg by mouth daily.      Allergies:   Latex; Shellfish allergy; Gluten; Sulfa antibiotics; and Other   Social History   Socioeconomic History  . Marital status: Married    Spouse name: Not on file  . Number of children: Not on file  . Years of education: Not on file  . Highest education level: Not on file  Occupational History  . Not on file  Social Needs  . Financial resource strain: Not on file  . Food insecurity:    Worry: Not on file    Inability: Not on file  . Transportation needs:    Medical: Not on file    Non-medical: Not on file  Tobacco Use  . Smoking status: Never Smoker  . Smokeless tobacco: Never Used  Substance and Sexual Activity  . Alcohol use: Yes    Comment: wine weekly-3 glasses  . Drug use: No  . Sexual activity: Not on file  Lifestyle  . Physical activity:    Days per week: Not on file    Minutes per session: Not on file  . Stress: Not on file  Relationships  . Social connections:    Talks on phone: Not on file    Gets together: Not on file    Attends religious service: Not on file    Active member of club or organization: Not on file    Attends meetings of clubs or organizations: Not on file    Relationship status: Not on file  Other Topics Concern  . Not on file  Social History Narrative  . Not on file     Family History: The patient's family history includes Alzheimer's disease in his father; Heart attack in his father; Heart disease in his father; Osteoporosis in his mother; Stroke in his father.  ROS:   Please see the history of present illness.    Chronic significant back pain easy bruising, autoimmune liver disease for which he uses azathioprine.  All other systems reviewed and are negative.  EKGs/Labs/Other Studies Reviewed:    The following studies were reviewed today:   Cardiac catheterization 09/13/2017:   Widely patent left main.  Dominant widely patent LAD.  Proximal to mid vessel  plaquing with up to 30% narrowing.  Widely patent circumflex with concentric 30 to 40% proximal plaque.  Dominant RCA with proximal to distal luminal irregularities up to 25 to 30%.  Known normal LV systolic function.  Upper normal LVEDP 17 mmHg.   RECOMMENDATIONS:    The patient does not have obstructive coronary disease.  He has mild coronary plaquing.  Risk factor modification is indicated.  Near continuous chest discomfort is not cardiac in origin.   EKG:  EKG is  ordered today.    Recent Labs: 08/27/2017: NT-Pro BNP 135 09/11/2017: BUN 8; Creatinine, Ser 0.82; Hemoglobin 10.4; Platelets 81; Potassium 4.2; Sodium 139  Recent Lipid Panel    Component Value Date/Time   CHOL  05/04/2009 0445    95        ATP III CLASSIFICATION:  <200     mg/dL   Desirable  200-239  mg/dL   Borderline  High  >=240    mg/dL   High          TRIG 43 05/04/2009 0445   HDL 25 (L) 05/04/2009 0445   CHOLHDL 3.8 05/04/2009 0445   VLDL 9 05/04/2009 0445   LDLCALC  05/04/2009 0445    61        Total Cholesterol/HDL:CHD Risk Coronary Heart Disease Risk Table                     Men   Women  1/2 Average Risk   3.4   3.3  Average Risk       5.0   4.4  2 X Average Risk   9.6   7.1  3 X Average Risk  23.4   11.0        Use the calculated Patient Ratio above and the CHD Risk Table to determine the patient's CHD Risk.        ATP III CLASSIFICATION (LDL):  <100     mg/dL   Optimal  100-129  mg/dL   Near or Above                    Optimal  130-159  mg/dL   Borderline  160-189  mg/dL   High  >190     mg/dL   Very High    Physical Exam:    VS:  BP 104/62   Pulse (!) 56   Ht 6' 4.5" (1.943 m)   Wt 271 lb 12.8 oz (123.3 kg)   BMI 32.65 kg/m     Wt Readings from Last 3 Encounters:  10/04/17 271 lb 12.8 oz (123.3 kg)  09/13/17 262 lb (118.8 kg)  08/27/17 268 lb 9.6 oz (121.8 kg)     GEN:  Well nourished, well developed in no acute distress HEENT: Normal NECK: No JVD. LYMPHATICS: No  lymphadenopathy CARDIAC: RRR, no murmur, no gallop, no edema. VASCULAR: Right radial unremarkable and 2+ radial pulses.  No bruits. RESPIRATORY:  Clear to auscultation without rales, wheezing or rhonchi  ABDOMEN: Soft, non-tender, non-distended, No pulsatile mass, MUSCULOSKELETAL: No deformity  SKIN: Warm and dry NEUROLOGIC:  Alert and oriented x 3 PSYCHIATRIC:  Normal affect   ASSESSMENT:    1. Dyspnea on exertion   2. Dilated aortic root ([place])   3. Coronary artery calcification seen on CAT scan   4. Autoimmune hepatitis ([place])   5. HYPERCHOLESTEROLEMIA   6. Left ventricular hypertrophy    PLAN:    In order of problems listed above:  1. Dyspnea is not related to significant coronary disease or significant heart failure.  This is his major complaint.  He may need to see a pulmonologist.  Also raises a question of whether azathioprine could be causing interstitial lung disease.  Will defer further work-up to Dr. Deforest Hoyles and Dr. Domenic Moras. 2. 2D Doppler echocardiogram in 1 year to follow-up dilated aortic root 3. Please see above cath report.  LDL cholesterol target less than 70, good blood pressure control with target 130/90, and encouraged aerobic activity.  Clinical follow-up in 1 year   Medication Adjustments/Labs and Tests Ordered: Current medicines are reviewed at length with the patient today.  Concerns regarding medicines are outlined above.  Orders Placed This Encounter  Procedures  . ECHOCARDIOGRAM COMPLETE   No orders of the defined types were placed in this encounter.   Patient Instructions  Medication Instructions:  Your physician recommends that you continue on your current  medications as directed. Please refer to the Current Medication list given to you today.  Labwork: None  Testing/Procedures: Your physician has requested that you have an echocardiogram just prior to seeing Dr. Tamala Julian back in one year. Echocardiography is a painless test that uses sound  waves to create images of your heart. It provides your doctor with information about the size and shape of your heart and how well your heart's chambers and valves are working. This procedure takes approximately one hour. There are no restrictions for this procedure.    Follow-Up: Your physician wants you to follow-up in: 1 year with Dr. Tamala Julian.  You will receive a reminder letter in the mail two months in advance. If you don't receive a letter, please call our office to schedule the follow-up appointment.   Any Other Special Instructions Will Be Listed Below (If Applicable).     If you need a refill on your cardiac medications before your next appointment, please call your pharmacy.      Signed, Sinclair Grooms, MD  10/04/2017 12:54 PM    [place]

## 2017-10-04 ENCOUNTER — Ambulatory Visit: Payer: BLUE CROSS/BLUE SHIELD | Admitting: Interventional Cardiology

## 2017-10-04 ENCOUNTER — Encounter: Payer: Self-pay | Admitting: Interventional Cardiology

## 2017-10-04 VITALS — BP 104/62 | HR 56 | Ht 76.5 in | Wt 271.8 lb

## 2017-10-04 DIAGNOSIS — K754 Autoimmune hepatitis: Secondary | ICD-10-CM

## 2017-10-04 DIAGNOSIS — R0609 Other forms of dyspnea: Secondary | ICD-10-CM | POA: Diagnosis not present

## 2017-10-04 DIAGNOSIS — I2510 Atherosclerotic heart disease of native coronary artery without angina pectoris: Secondary | ICD-10-CM | POA: Diagnosis not present

## 2017-10-04 DIAGNOSIS — R0600 Dyspnea, unspecified: Secondary | ICD-10-CM

## 2017-10-04 DIAGNOSIS — I77810 Thoracic aortic ectasia: Secondary | ICD-10-CM | POA: Diagnosis not present

## 2017-10-04 DIAGNOSIS — E7800 Pure hypercholesterolemia, unspecified: Secondary | ICD-10-CM

## 2017-10-04 DIAGNOSIS — I517 Cardiomegaly: Secondary | ICD-10-CM

## 2017-10-04 NOTE — Patient Instructions (Addendum)
Medication Instructions:  Your physician recommends that you continue on your current medications as directed. Please refer to the Current Medication list given to you today.  Labwork: None  Testing/Procedures: Your physician has requested that you have an echocardiogram just prior to seeing Dr. Tamala Julian back in one year. Echocardiography is a painless test that uses sound waves to create images of your heart. It provides your doctor with information about the size and shape of your heart and how well your heart's chambers and valves are working. This procedure takes approximately one hour. There are no restrictions for this procedure.    Follow-Up: Your physician wants you to follow-up in: 1 year with Dr. Tamala Julian.  You will receive a reminder letter in the mail two months in advance. If you don't receive a letter, please call our office to schedule the follow-up appointment.   Any Other Special Instructions Will Be Listed Below (If Applicable).     If you need a refill on your cardiac medications before your next appointment, please call your pharmacy.

## 2017-10-15 DIAGNOSIS — K5100 Ulcerative (chronic) pancolitis without complications: Secondary | ICD-10-CM | POA: Diagnosis not present

## 2017-10-18 ENCOUNTER — Telehealth: Payer: Self-pay | Admitting: Oncology

## 2017-10-18 ENCOUNTER — Telehealth: Payer: Self-pay | Admitting: *Deleted

## 2017-10-18 DIAGNOSIS — R0600 Dyspnea, unspecified: Secondary | ICD-10-CM

## 2017-10-18 NOTE — Telephone Encounter (Signed)
-----   Message from Belva Crome, MD sent at 10/18/2017  1:51 PM EDT ----- Regarding: Pulmonary Consult Please have Mr. Kenneth set-up for a Pulmonary Consult to Evaluate Dyspnea ? Pulmonary Fibrosis for Azathioprine.

## 2017-10-18 NOTE — Telephone Encounter (Signed)
Patient called to reschedule

## 2017-11-15 DIAGNOSIS — L821 Other seborrheic keratosis: Secondary | ICD-10-CM | POA: Diagnosis not present

## 2017-11-15 DIAGNOSIS — Z85828 Personal history of other malignant neoplasm of skin: Secondary | ICD-10-CM | POA: Diagnosis not present

## 2017-11-15 DIAGNOSIS — L570 Actinic keratosis: Secondary | ICD-10-CM | POA: Diagnosis not present

## 2017-11-15 DIAGNOSIS — D1801 Hemangioma of skin and subcutaneous tissue: Secondary | ICD-10-CM | POA: Diagnosis not present

## 2017-11-15 DIAGNOSIS — L812 Freckles: Secondary | ICD-10-CM | POA: Diagnosis not present

## 2017-11-15 DIAGNOSIS — B078 Other viral warts: Secondary | ICD-10-CM | POA: Diagnosis not present

## 2017-11-20 ENCOUNTER — Encounter: Payer: Self-pay | Admitting: Pulmonary Disease

## 2017-11-20 ENCOUNTER — Ambulatory Visit: Payer: BLUE CROSS/BLUE SHIELD | Admitting: Pulmonary Disease

## 2017-11-20 VITALS — BP 118/68 | HR 57 | Ht 76.5 in | Wt 278.4 lb

## 2017-11-20 DIAGNOSIS — R0609 Other forms of dyspnea: Secondary | ICD-10-CM | POA: Diagnosis not present

## 2017-11-20 DIAGNOSIS — R0600 Dyspnea, unspecified: Secondary | ICD-10-CM | POA: Insufficient documentation

## 2017-11-20 NOTE — Patient Instructions (Signed)
No clear cause found for your shortness of breath No limitation to exercise. Call us back if symptoms get worse over the next few months

## 2017-11-20 NOTE — Assessment & Plan Note (Signed)
There is no evidence of airways disease.  Spirometry shows mild restriction CT chest from 2018 was personally reviewed and does not show any evidence of parenchymal disease or interstitial lung disease.  His lung sounds are also normal. Echo shows normal pulmonary pressures which rules out portal- pulmonary hypertension.  I do not feel any further work-up is indicated at this time , we will undertake a conditioning program will observe and if dyspnea gets worse he will call

## 2017-11-20 NOTE — Progress Notes (Signed)
Subjective:    Patient ID: Corey Shannon, male    DOB: 1950-05-18, 67 y.o.   MRN: [id number]  HPI    Chief Complaint  Patient presents with  . Pulm Consult    Referred by Dr. Daneen Schick for DOE (only when going uphill).     67 year old never smoker, retired English as a second language teacher from [place] referred by cardiology for evaluation of shortness of breath.  He finds that for the last 3 months he feels short of breath while walking [place].  The same time he is able to bike at low intensity through city roads for about 35 minutes.  He denies shortness of breath and walking on level ground or even on climbing stairs does not have any wheezing or seasonal allergies.  There is no history of asthma or cough.  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 Thana Farr and has been maintained on azathioprine for more than 12 years.  He wonders if this is anything to do with his shortness of breath.  He underwent a detailed cardiac evaluation including normal echo, nuclear stress test and cardiac cath.  He has an ascending aortic aneurysm about 4.2 cm that is being followed annually.  He underwent neck surgery 08/2016 which was uneventful  He is retired, lives with his wife and is an active lifestyle.  He is traveling to [place] next week.  I personally reviewed CT images from 04/2016 which shows normal lung parenchyma  Past Medical History:  Diagnosis Date  . Arthritis   . 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 GERD (gastroesophageal reflux disease)   . Gluten intolerance   . History of nuclear stress test    Myoview 1/17: EF 61%, inferior and inferolateral ischemia, low risk  . Hyperlipidemia   . Pneumonia     Past Surgical History:  Procedure Laterality Date  . BACK SURGERY    . CHOLECYSTECTOMY    . ESOPHAGEAL BANDING N/A 11/29/2012   Procedure: ESOPHAGEAL BANDING;  Surgeon: Beryle Beams, MD;  Location: [facility];  Service: Endoscopy;  Laterality: N/A;  . ESOPHAGEAL BANDING N/A 01/24/2013   Procedure: ESOPHAGEAL BANDING;  Surgeon: Beryle Beams, MD;  Location: [facility];  Service: Endoscopy;  Laterality: N/A;  . ESOPHAGEAL BANDING N/A 02/21/2013   Procedure: ESOPHAGEAL BANDING;  Surgeon: Beryle Beams, MD;  Location: [facility];  Service: Endoscopy;  Laterality: N/A;  . ESOPHAGEAL BANDING N/A 03/20/2014   Procedure: ESOPHAGEAL BANDING;  Surgeon: Beryle Beams, MD;  Location: [facility];  Service: Endoscopy;  Laterality: N/A;  . ESOPHAGOGASTRODUODENOSCOPY N/A 11/29/2012   Procedure: ESOPHAGOGASTRODUODENOSCOPY (EGD);  Surgeon: Beryle Beams, MD;  Location: [facility];  Service: Endoscopy;  Laterality: N/A;  . ESOPHAGOGASTRODUODENOSCOPY N/A 01/24/2013   Procedure: ESOPHAGOGASTRODUODENOSCOPY (EGD);  Surgeon: Beryle Beams, MD;  Location: [facility];  Service: Endoscopy;  Laterality: N/A;  . ESOPHAGOGASTRODUODENOSCOPY N/A 02/21/2013   Procedure: ESOPHAGOGASTRODUODENOSCOPY (EGD);  Surgeon: Beryle Beams, MD;  Location: [facility];  Service: Endoscopy;  Laterality: N/A;  . ESOPHAGOGASTRODUODENOSCOPY (EGD) WITH PROPOFOL N/A 03/20/2014   Procedure: ESOPHAGOGASTRODUODENOSCOPY (EGD) WITH PROPOFOL;  Surgeon: Beryle Beams, MD;  Location: [facility];  Service: Endoscopy;  Laterality: N/A;  . LEFT HEART CATH AND CORONARY ANGIOGRAPHY N/A 09/13/2017   Procedure: LEFT HEART CATH AND CORONARY ANGIOGRAPHY;  Surgeon: Belva Crome, MD;  Location: [facility];  Service: Cardiovascular;  Laterality:  N/A;  . SPINE SURGERY     x 4, 9 disc fusion    Allergies  Allergen Reactions  . Latex Other (See Comments)    Burns skin off  . Shellfish Allergy Anaphylaxis, Swelling and Other (See Comments)    Legs swell and throat puffed out  . Gluten Other (See Comments)    Upset stomach   . Sulfa Antibiotics Other (See Comments)    Pass out  . Other Rash    PINEAPPLE   Social History    Socioeconomic History  . Marital status: Married    Spouse name: Not on file  . Number of children: Not on file  . Years of education: Not on file  . Highest education level: Not on file  Occupational History  . Not on file  Social Needs  . Financial resource strain: Not on file  . Food insecurity:    Worry: Not on file    Inability: Not on file  . Transportation needs:    Medical: Not on file    Non-medical: Not on file  Tobacco Use  . Smoking status: Never Smoker  . Smokeless tobacco: Never Used  Substance and Sexual Activity  . Alcohol use: Yes    Comment: wine weekly-3 glasses  . Drug use: No  . Sexual activity: Not on file  Lifestyle  . Physical activity:    Days per week: Not on file    Minutes per session: Not on file  . Stress: Not on file  Relationships  . Social connections:    Talks on phone: Not on file    Gets together: Not on file    Attends religious service: Not on file    Active member of club or organization: Not on file    Attends meetings of clubs or organizations: Not on file    Relationship status: Not on file  . Intimate partner violence:    Fear of current or ex partner: Not on file    Emotionally abused: Not on file    Physically abused: Not on file    Forced sexual activity: Not on file  Other Topics Concern  . Not on file  Social History Narrative  . Not on file    Family History  Problem Relation Age of Onset  . Osteoporosis Mother   . Heart attack Father   . Heart disease Father   . Alzheimer's disease Father   . Stroke Father      Review of Systems  Constitutional: Negative for fever and unexpected weight change.  HENT: Negative for congestion, dental problem, ear pain, nosebleeds, postnasal drip, rhinorrhea, sinus pressure, sneezing, sore throat and trouble swallowing.   Eyes: Negative for redness and itching.  Respiratory: Positive for shortness of breath. Negative for cough, chest tightness and wheezing.    Cardiovascular: Negative for palpitations and leg swelling.  Gastrointestinal: Negative for nausea and vomiting.  Genitourinary: Negative for dysuria.  Musculoskeletal: Negative for joint swelling.  Skin: Negative for rash.  Allergic/Immunologic: Negative.  Negative for environmental allergies, food allergies and immunocompromised state.  Neurological: Negative for headaches.  Hematological: Does not bruise/bleed easily.  Psychiatric/Behavioral: Negative for dysphoric mood. The patient is not nervous/anxious.        Objective:   Physical Exam  Gen. Pleasant, well-nourished, in no distress, normal affect ENT - no lesions, no post nasal drip Neck: No JVD, no thyromegaly, no carotid bruits Lungs: no use of accessory muscles, no dullness to percussion, clear without rales or  rhonchi  Cardiovascular: Rhythm regular, heart sounds  normal, no murmurs or gallops, no peripheral edema Abdomen: soft and non-tender, no hepatosplenomegaly, BS normal. Musculoskeletal: No deformities, no cyanosis or clubbing Neuro:  alert, non focal Skin -erythematous lesions over both forearms and legs related to Imuran       Assessment & Plan:

## 2017-11-25 DIAGNOSIS — L03115 Cellulitis of right lower limb: Secondary | ICD-10-CM | POA: Diagnosis not present

## 2017-12-19 ENCOUNTER — Ambulatory Visit: Payer: BLUE CROSS/BLUE SHIELD | Admitting: Oncology

## 2017-12-19 ENCOUNTER — Other Ambulatory Visit: Payer: BLUE CROSS/BLUE SHIELD

## 2017-12-25 ENCOUNTER — Inpatient Hospital Stay: Payer: BLUE CROSS/BLUE SHIELD | Attending: Oncology

## 2017-12-25 ENCOUNTER — Telehealth: Payer: Self-pay | Admitting: Oncology

## 2017-12-25 ENCOUNTER — Inpatient Hospital Stay (HOSPITAL_BASED_OUTPATIENT_CLINIC_OR_DEPARTMENT_OTHER): Payer: BLUE CROSS/BLUE SHIELD | Admitting: Oncology

## 2017-12-25 VITALS — BP 109/68 | HR 62 | Temp 98.4°F | Resp 17 | Ht 76.5 in | Wt 275.1 lb

## 2017-12-25 DIAGNOSIS — D509 Iron deficiency anemia, unspecified: Secondary | ICD-10-CM | POA: Diagnosis not present

## 2017-12-25 DIAGNOSIS — D72819 Decreased white blood cell count, unspecified: Secondary | ICD-10-CM | POA: Diagnosis not present

## 2017-12-25 DIAGNOSIS — D6959 Other secondary thrombocytopenia: Secondary | ICD-10-CM | POA: Insufficient documentation

## 2017-12-25 DIAGNOSIS — T8090XA Unspecified complication following infusion and therapeutic injection, initial encounter: Secondary | ICD-10-CM

## 2017-12-25 DIAGNOSIS — K7460 Unspecified cirrhosis of liver: Secondary | ICD-10-CM | POA: Diagnosis not present

## 2017-12-25 LAB — CBC WITH DIFFERENTIAL (CANCER CENTER ONLY)
Abs Immature Granulocytes: 0 10*3/uL (ref 0.00–0.07)
Basophils Absolute: 0 10*3/uL (ref 0.0–0.1)
Basophils Relative: 1 %
Eosinophils Absolute: 0.1 10*3/uL (ref 0.0–0.5)
Eosinophils Relative: 4 %
HCT: 31.2 % — ABNORMAL LOW (ref 39.0–52.0)
Hemoglobin: 9.7 g/dL — ABNORMAL LOW (ref 13.0–17.0)
Immature Granulocytes: 0 %
Lymphocytes Relative: 19 %
Lymphs Abs: 0.3 10*3/uL — ABNORMAL LOW (ref 0.7–4.0)
MCH: 28.9 pg (ref 26.0–34.0)
MCHC: 31.1 g/dL (ref 30.0–36.0)
MCV: 92.9 fL (ref 80.0–100.0)
Monocytes Absolute: 0.2 10*3/uL (ref 0.1–1.0)
Monocytes Relative: 13 %
Neutro Abs: 0.8 10*3/uL — ABNORMAL LOW (ref 1.7–7.7)
Neutrophils Relative %: 63 %
Platelet Count: 71 10*3/uL — ABNORMAL LOW (ref 150–400)
RBC: 3.36 MIL/uL — ABNORMAL LOW (ref 4.22–5.81)
RDW: 17 % — ABNORMAL HIGH (ref 11.5–15.5)
WBC Count: 1.4 10*3/uL — ABNORMAL LOW (ref 4.0–10.5)
nRBC: 0 % (ref 0.0–0.2)

## 2017-12-25 LAB — IRON AND TIBC
Iron: 28 ug/dL — ABNORMAL LOW (ref 42–163)
Saturation Ratios: 8 % — ABNORMAL LOW (ref 42–163)
TIBC: 349 ug/dL (ref 202–409)
UIBC: 321 ug/dL

## 2017-12-25 LAB — FERRITIN: Ferritin: 14 ng/mL — ABNORMAL LOW (ref 24–336)

## 2017-12-25 NOTE — Progress Notes (Signed)
Hematology and Oncology Follow Up Visit  Corey Shannon [id number] 09/14/1950 67 y.o. 12/25/2017 8:46 AM [name], MDHusain, Denton Ar, MD   Principle Diagnosis: 67 year old with:  1. 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    2. Iron deficiency anemia related to chronic blood losses and cirrhosis of the liver diagnosed in 2012.  Current therapy: IV iron replacement with the last infusion January 2019.  Interim History: [name] returns today for a repeat evaluation.  Since the last visit, he reports no major changes in his health.  He is diagnosed with prostatitis but otherwise feeling well.  His appetite and performance status is unchanged.  He denies any hematuria, dysuria or epistaxis.  He denies any bleeding complications or easy bruising.  His performance status and quality of life is unchanged.  He denies any recurrent infections.  He does not report any headaches, blurry vision, syncope or seizures.  He denies any alteration mental status or syncope.  He does not report any fevers, chills or sweats. He does not report any cough, wheezing or hemoptysis. Does not report any nausea, vomiting, abdominal pain.  He denies any changes in his bowel habits.  He does not report any frequency, urgency or hesitancy. He does not report any bone pain or pathological fractures.  He denies any skin rashes or lesions.  He denies any clotting tendency.  He denies any injuries in his mood.  Remaining review of systems is negative.   Medications: I have reviewed the patient's current medications.  Current Outpatient Medications  Medication Sig Dispense Refill  . alfuzosin (UROXATRAL) 10 MG 24 hr tablet Take 10 mg by mouth daily with breakfast.    . atorvastatin (LIPITOR) 20 MG tablet Take 20 mg by mouth daily.    Marland Kitchen azaTHIOprine (IMURAN) 50 MG tablet Take 100 mg by mouth daily with breakfast.     . Calcium Carb-Cholecalciferol (CALCIUM 1000 +  D) 1000-800 MG-UNIT TABS Take 1 tablet by mouth daily.    Marland Kitchen esomeprazole (NEXIUM) 40 MG capsule Take 40 mg by mouth daily before breakfast.    . fish oil-omega-3 fatty acids 1000 MG capsule Take 2 g by mouth daily.    . nadolol (CORGARD) 40 MG tablet Take 40 mg by mouth daily with breakfast.     . tadalafil (CIALIS) 5 MG tablet Take 5 mg by mouth daily.      No current facility-administered medications for this visit.      Allergies:  Allergies  Allergen Reactions  . Latex Other (See Comments)    Burns skin off  . Shellfish Allergy Anaphylaxis, Swelling and Other (See Comments)    Legs swell and throat puffed out  . Gluten Other (See Comments)    Upset stomach   . Sulfa Antibiotics Other (See Comments)    Pass out  . Other Rash    PINEAPPLE    Past Medical History, Surgical history, Social history, and Family History reviewed and remain unchanged.   Physical Exam: Blood pressure 109/68, pulse 62, temperature 98.4 F (36.9 C), temperature source Oral, resp. rate 17, height 6' 4.5" (1.943 m), weight 275 lb 1.6 oz (124.8 kg), SpO2 99 %.   ECOG: 1   General appearance: Comfortable appearing without any discomfort Head: Normocephalic without any trauma Oropharynx: Mucous membranes are moist and pink without any thrush or ulcers. Eyes: Pupils are equal and round reactive to light. Lymph nodes: No cervical, supraclavicular, inguinal or axillary lymphadenopathy.  Heart:regular rate and rhythm.  S1 and S2 without leg edema. Lung: Clear without any rhonchi or wheezes.  No dullness to percussion. Abdomin: Soft, nontender, nondistended with good bowel sounds.  No hepatosplenomegaly. Musculoskeletal: No joint deformity or effusion.  Full range of motion noted. Neurological: No deficits noted on motor, sensory and deep tendon reflex exam. Skin: No petechial rash or dryness.  No petechia or ecchymosis.   Lab Results: Lab Results  Component Value Date   WBC 1.4 (L) 12/25/2017    HGB 9.7 (L) 12/25/2017   HCT 31.2 (L) 12/25/2017   MCV 92.9 12/25/2017   PLT 71 (L) 12/25/2017     Chemistry      Component Value Date/Time   NA 139 09/11/2017 1427   K 4.2 09/11/2017 1427   CL 106 09/11/2017 1427   CO2 24 09/11/2017 1427   BUN 8 09/11/2017 1427   CREATININE 0.82 09/11/2017 1427   CREATININE 0.93 03/11/2015 1521      Component Value Date/Time   CALCIUM 7.8 (L) 09/11/2017 1427   ALKPHOS 62 01/28/2014 0931   AST 45 (H) 01/28/2014 0931   ALT 20 01/28/2014 0931   BILITOT 1.0 01/28/2014 0931       Impression and Plan:  67 year old man with  1. Thrombocytopenia: Noted in 2011 related to his chronic liver disease and splenic sequestration.  His laboratory data were personally reviewed today and continues to show stable platelet count without any active bleeding.  He is treatment options were reviewed today and at this time does not require any intervention.  Platelet stimulating agents may be needed in the future he develops any active bleeding.  The differential diagnosis for this thrombocytopenia was reviewed again which include ITP and medication which are considered less likely.   2.  Iron deficiency anemia: Iron studies are currently pending and his hemoglobin drifting slowly.  Repeat intravenous iron may be needed in the future.  3.  Leukocytopenia: Due to splenic sequestration and cirrhosis of the liver.  No active infection noted.  I recommended continued active surveillance and repeat laboratory testing.  No growth factor support is needed.  4. Follow-up: Will be in 6 months.   15 minutes was spent with the patient face-to-face today.  More than 50% of the time was dedicated for reviewing his disease status, discussing differential diagnosis and treatment options.  Zola Button, MD 10/22/20198:46 AM

## 2017-12-25 NOTE — Telephone Encounter (Signed)
Appts scheduled avs/calendar printed per 10/22 los

## 2017-12-26 DIAGNOSIS — K5100 Ulcerative (chronic) pancolitis without complications: Secondary | ICD-10-CM | POA: Diagnosis not present

## 2017-12-28 DIAGNOSIS — N419 Inflammatory disease of prostate, unspecified: Secondary | ICD-10-CM | POA: Diagnosis not present

## 2017-12-28 DIAGNOSIS — R300 Dysuria: Secondary | ICD-10-CM | POA: Diagnosis not present

## 2018-01-01 DIAGNOSIS — N411 Chronic prostatitis: Secondary | ICD-10-CM | POA: Diagnosis not present

## 2018-01-24 ENCOUNTER — Other Ambulatory Visit: Payer: Self-pay | Admitting: Ophthalmology

## 2018-01-24 DIAGNOSIS — N411 Chronic prostatitis: Secondary | ICD-10-CM | POA: Diagnosis not present

## 2018-01-24 DIAGNOSIS — R102 Pelvic and perineal pain: Secondary | ICD-10-CM | POA: Diagnosis not present

## 2018-01-24 DIAGNOSIS — L821 Other seborrheic keratosis: Secondary | ICD-10-CM | POA: Diagnosis not present

## 2018-01-24 DIAGNOSIS — D23122 Other benign neoplasm of skin of left lower eyelid, including canthus: Secondary | ICD-10-CM | POA: Diagnosis not present

## 2018-01-24 DIAGNOSIS — G8929 Other chronic pain: Secondary | ICD-10-CM | POA: Diagnosis not present

## 2018-01-24 DIAGNOSIS — N400 Enlarged prostate without lower urinary tract symptoms: Secondary | ICD-10-CM | POA: Diagnosis not present

## 2018-04-01 ENCOUNTER — Encounter: Payer: Self-pay | Admitting: Oncology

## 2018-04-04 ENCOUNTER — Other Ambulatory Visit: Payer: Self-pay | Admitting: Internal Medicine

## 2018-04-04 DIAGNOSIS — K754 Autoimmune hepatitis: Secondary | ICD-10-CM

## 2018-04-10 ENCOUNTER — Ambulatory Visit
Admission: RE | Admit: 2018-04-10 | Discharge: 2018-04-10 | Disposition: A | Discharge status: Home / Self Care | Payer: Medicare Other | Source: Ambulatory Visit | Attending: Internal Medicine | Admitting: Internal Medicine

## 2018-04-10 ENCOUNTER — Other Ambulatory Visit: Payer: Self-pay | Admitting: Internal Medicine

## 2018-04-10 DIAGNOSIS — K754 Autoimmune hepatitis: Secondary | ICD-10-CM

## 2018-06-12 ENCOUNTER — Telehealth: Payer: Self-pay | Admitting: Oncology

## 2018-06-12 NOTE — Telephone Encounter (Signed)
R/s appt per 4/6 sch message - pt aware of new appt date and time

## 2018-06-26 ENCOUNTER — Ambulatory Visit: Payer: BLUE CROSS/BLUE SHIELD | Admitting: Oncology

## 2018-06-26 ENCOUNTER — Other Ambulatory Visit: Payer: BLUE CROSS/BLUE SHIELD

## 2018-08-05 ENCOUNTER — Telehealth: Payer: Self-pay | Admitting: Interventional Cardiology

## 2018-08-05 NOTE — Telephone Encounter (Signed)
Will route to Dr. Tamala Julian to make him aware.

## 2018-08-05 NOTE — Telephone Encounter (Signed)
What?! I got to call him.

## 2018-08-05 NOTE — Telephone Encounter (Signed)
° °  Patient wants Dr Tamala Julian to be made aware he is in the process of waiting/getting a liver transplant.  [phone]  Dr Laurier Nancy- [facility]

## 2018-08-12 ENCOUNTER — Telehealth (HOSPITAL_COMMUNITY): Payer: Self-pay | Admitting: *Deleted

## 2018-08-12 NOTE — Telephone Encounter (Signed)
COVID-19 Pre-Screening Questions:  . Do you currently have a fever? NO (yes = cancel and refer to pcp for e-visit) . Have you recently travelled on a cruise, internationally, or to [place], [place], [place], [place], [place], or [place], [place] [employer]) ? NO (yes = cancel, stay home, monitor symptoms, and contact pcp or initiate e-visit if symptoms develop) . Have you been in contact with someone that is currently pending confirmation of Covid19 testing or has been confirmed to have the [place] virus?  NO (yes = cancel, stay home, away from tested individual, monitor symptoms, and contact pcp or initiate e-visit if symptoms develop) . Are you currently experiencing fatigue or cough? NO (yes = pt should be prepared to have a mask placed at the time of their visit).   . Reiterated no additional visitors. Eartha Inch no earlier than 15 minutes before appointment time. . Please bring own mask.  Coleta Grosshans

## 2018-08-13 ENCOUNTER — Ambulatory Visit (HOSPITAL_COMMUNITY): Payer: Medicare Other | Attending: Cardiovascular Disease

## 2018-08-13 ENCOUNTER — Other Ambulatory Visit: Payer: Self-pay

## 2018-08-13 DIAGNOSIS — E785 Hyperlipidemia, unspecified: Secondary | ICD-10-CM | POA: Diagnosis not present

## 2018-08-13 DIAGNOSIS — I77810 Thoracic aortic ectasia: Secondary | ICD-10-CM | POA: Insufficient documentation

## 2018-08-16 ENCOUNTER — Telehealth: Payer: Self-pay | Admitting: Interventional Cardiology

## 2018-08-16 NOTE — Telephone Encounter (Signed)
Spoke with pt and went over echo results.  Pt verbalized understanding.

## 2018-08-16 NOTE — Telephone Encounter (Signed)
New message:    Patient calling stating some one called him. I did not see a note. Please call patient.

## 2018-09-18 ENCOUNTER — Telehealth: Payer: Self-pay | Admitting: Interventional Cardiology

## 2018-09-18 NOTE — Telephone Encounter (Signed)
I spoke to the patient who was wondering if he could see Dr Tamala Julian any earlier than his October appt.  He will be in [facility] 7/22 and 7/24.  I let him know that I will have Dr Thompson Caul nurse advise on this next week.  He verbalized understanding.

## 2018-09-18 NOTE — Telephone Encounter (Signed)
°  Patient called to set up his yearly appointment, but was nervous when the soonest he could get in was October.  The patient is set to have a liver transplant early 2021, and wanted to know if Dr. Tamala Julian wanted to see him sooner than October.

## 2018-09-23 NOTE — Telephone Encounter (Signed)
Left message to call back  

## 2018-09-23 NOTE — Telephone Encounter (Signed)
Spoke with pt and moved appt up to August

## 2018-09-27 ENCOUNTER — Inpatient Hospital Stay (HOSPITAL_BASED_OUTPATIENT_CLINIC_OR_DEPARTMENT_OTHER): Payer: Medicare Other | Admitting: Oncology

## 2018-09-27 ENCOUNTER — Other Ambulatory Visit: Payer: Self-pay

## 2018-09-27 ENCOUNTER — Inpatient Hospital Stay: Payer: Medicare Other | Attending: Oncology

## 2018-09-27 VITALS — BP 117/70 | HR 58 | Temp 98.7°F | Resp 17 | Ht 76.5 in | Wt 254.9 lb

## 2018-09-27 DIAGNOSIS — D6959 Other secondary thrombocytopenia: Secondary | ICD-10-CM

## 2018-09-27 DIAGNOSIS — K7460 Unspecified cirrhosis of liver: Secondary | ICD-10-CM

## 2018-09-27 DIAGNOSIS — D500 Iron deficiency anemia secondary to blood loss (chronic): Secondary | ICD-10-CM | POA: Diagnosis not present

## 2018-09-27 DIAGNOSIS — D509 Iron deficiency anemia, unspecified: Secondary | ICD-10-CM

## 2018-09-27 DIAGNOSIS — T8090XA Unspecified complication following infusion and therapeutic injection, initial encounter: Secondary | ICD-10-CM

## 2018-09-27 LAB — FERRITIN: Ferritin: 29 ng/mL (ref 24–336)

## 2018-09-27 LAB — IRON AND TIBC
Iron: 59 ug/dL (ref 42–163)
Saturation Ratios: 19 % — ABNORMAL LOW (ref 20–55)
TIBC: 315 ug/dL (ref 202–409)
UIBC: 256 ug/dL (ref 117–376)

## 2018-09-27 LAB — CBC WITH DIFFERENTIAL (CANCER CENTER ONLY)
Abs Immature Granulocytes: 0 10*3/uL (ref 0.00–0.07)
Basophils Absolute: 0 10*3/uL (ref 0.0–0.1)
Basophils Relative: 0 %
Eosinophils Absolute: 0.1 10*3/uL (ref 0.0–0.5)
Eosinophils Relative: 4 %
HCT: 34.2 % — ABNORMAL LOW (ref 39.0–52.0)
Hemoglobin: 11.1 g/dL — ABNORMAL LOW (ref 13.0–17.0)
Lymphocytes Relative: 16 %
Lymphs Abs: 0.3 10*3/uL — ABNORMAL LOW (ref 0.7–4.0)
MCH: 33 pg (ref 26.0–34.0)
MCHC: 32.5 g/dL (ref 30.0–36.0)
MCV: 101.8 fL — ABNORMAL HIGH (ref 80.0–100.0)
Monocytes Absolute: 0.1 10*3/uL (ref 0.1–1.0)
Monocytes Relative: 8 %
Neutro Abs: 1.3 10*3/uL — ABNORMAL LOW (ref 1.7–17.7)
Neutrophils Relative %: 72 %
Platelet Count: 69 10*3/uL — ABNORMAL LOW (ref 150–400)
RBC: 3.36 MIL/uL — ABNORMAL LOW (ref 4.22–5.81)
RDW: 19.8 % — ABNORMAL HIGH (ref 11.5–15.5)
WBC Count: 1.8 10*3/uL — ABNORMAL LOW (ref 4.0–10.5)
nRBC: 0 % (ref 0.0–0.2)

## 2018-09-27 NOTE — Progress Notes (Signed)
Hematology and Oncology Follow Up Visit  Corey Shannon [id number] 02-15-1951 68 y.o. 09/27/2018 9:46 AM [name], MDHusain, Denton Ar, MD   Principle Diagnosis: 68 year old with:  1. Thrombocytopenia diagnosed in 2011 due to splenic sequestration and cirrhosis of the liver.  2.  Anemia related to iron deficiency and chronic blood losses related to cirrhosis of the liver.  Current therapy: Repeat IV iron infusion as needed.  Interim History: [name] is here for a follow-up visit.  Since the last visit, he reports no major changes in his health.  He is currently under evaluation for liver transplant at [facility] and recently placed on the waiting list.  He denies any recent hospitalization or illnesses.  He denies any bleeding or infections.  He has not reported any changes in his mentation or tremors.  Patient denied any alteration mental status, neuropathy, confusion or dizziness.  Denies any headaches or lethargy.  Denies any night sweats, weight loss or changes in appetite.  Denied orthopnea, dyspnea on exertion or chest discomfort.  Denies shortness of breath, difficulty breathing hemoptysis or cough.  Denies any abdominal distention, nausea, early satiety or dyspepsia.  Denies any hematuria, frequency, dysuria or nocturia.  Denies any skin irritation, dryness or rash.  Denies any ecchymosis or petechiae.  Denies any lymphadenopathy or clotting.  Denies any heat or cold intolerance.  Denies any anxiety or depression.  Remaining review of system is negative.    Medications: Updated without any changes. Current Outpatient Medications  Medication Sig Dispense Refill  . alfuzosin (UROXATRAL) 10 MG 24 hr tablet Take 10 mg by mouth daily with breakfast.    . atorvastatin (LIPITOR) 20 MG tablet Take 20 mg by mouth daily.    Marland Kitchen azaTHIOprine (IMURAN) 50 MG tablet Take 100 mg by mouth daily with breakfast.     . Calcium Carb-Cholecalciferol (CALCIUM 1000 + D) 1000-800  MG-UNIT TABS Take 1 tablet by mouth daily.    Marland Kitchen esomeprazole (NEXIUM) 40 MG capsule Take 40 mg by mouth daily before breakfast.    . fish oil-omega-3 fatty acids 1000 MG capsule Take 2 g by mouth daily.    . nadolol (CORGARD) 40 MG tablet Take 40 mg by mouth daily with breakfast.     . tadalafil (CIALIS) 5 MG tablet Take 5 mg by mouth daily.      No current facility-administered medications for this visit.      Allergies:  Allergies  Allergen Reactions  . Latex Other (See Comments)    Burns skin off  . Shellfish Allergy Anaphylaxis, Swelling and Other (See Comments)    Legs swell and throat puffed out  . Gluten Other (See Comments)    Upset stomach   . Sulfa Antibiotics Other (See Comments)    Pass out  . Other Rash    PINEAPPLE    Past Medical History, Surgical history, Social history, and Family History remain without any change in review.   Physical Exam: Blood pressure 117/70, pulse (!) 58, temperature 98.7 F (37.1 C), temperature source Oral, resp. rate 17, height 6' 4.5" (1.943 m), weight 254 lb 14.4 oz (115.6 kg), SpO2 100 %.   ECOG: 1    General appearance: Alert, awake without any distress. Head: Atraumatic without abnormalities Oropharynx: Without any thrush or ulcers. Eyes: No scleral icterus. Lymph nodes: No lymphadenopathy noted in the cervical, supraclavicular, or axillary nodes Heart:regular rate and rhythm, without any murmurs or gallops.   Lung: Clear to auscultation without any rhonchi, wheezes  or dullness to percussion. Abdomin: Soft, nontender without any shifting dullness or ascites. Musculoskeletal: No clubbing or cyanosis. Neurological: No motor or sensory deficits. Skin: No rashes or lesions.   Lab Results: Lab Results  Component Value Date   WBC 1.8 (L) 09/27/2018   HGB 11.1 (L) 09/27/2018   HCT 34.2 (L) 09/27/2018   MCV 101.8 (H) 09/27/2018   PLT 69 (L) 09/27/2018     Chemistry      Component Value Date/Time   NA 139 09/11/2017  1427   K 4.2 09/11/2017 1427   CL 106 09/11/2017 1427   CO2 24 09/11/2017 1427   BUN 8 09/11/2017 1427   CREATININE 0.82 09/11/2017 1427   CREATININE 0.93 03/11/2015 1521      Component Value Date/Time   CALCIUM 7.8 (L) 09/11/2017 1427   ALKPHOS 62 01/28/2014 0931   AST 45 (H) 01/28/2014 0931   ALT 20 01/28/2014 0931   BILITOT 1.0 01/28/2014 0931       Impression and Plan:  68 year old man with  1. Thrombocytopenia related to splenic sequestration and cirrhosis of the liver diagnosed in 2011.  Platelet count today was 69 without any active bleeding.  No intervention is needed at this time.  The natural course of these findings and the fluctuation in platelet count was reviewed and at this time does not require any intervention.   2.  Iron deficiency anemia: Related to chronic blood losses and cirrhosis of the liver.  His hemoglobin is adequate today and iron studies are pending.  Iron replacement will be needed if he develops worsening iron deficiency.  3.  Cirrhosis of the liver: He is currently under evaluation for transplant and currently on the waiting list.   4. Follow-up: In 6 months for repeat evaluation.  15 minutes was spent with the patient face-to-face today.  More than 50% of the time was spent on reviewing his disease status, complications related to his disease and future plan of care.  Zola Button, MD 7/24/20209:46 AM

## 2018-09-30 ENCOUNTER — Telehealth: Payer: Self-pay | Admitting: Oncology

## 2018-09-30 ENCOUNTER — Other Ambulatory Visit (HOSPITAL_COMMUNITY): Payer: BLUE CROSS/BLUE SHIELD

## 2018-09-30 NOTE — Telephone Encounter (Signed)
Scheduled per los. Mailed printout  °

## 2018-10-23 ENCOUNTER — Ambulatory Visit (INDEPENDENT_AMBULATORY_CARE_PROVIDER_SITE_OTHER): Payer: Medicare Other | Admitting: Interventional Cardiology

## 2018-10-23 ENCOUNTER — Encounter: Payer: Self-pay | Admitting: Interventional Cardiology

## 2018-10-23 ENCOUNTER — Other Ambulatory Visit: Payer: Self-pay

## 2018-10-23 VITALS — BP 102/68 | HR 53 | Ht 76.0 in | Wt 240.0 lb

## 2018-10-23 DIAGNOSIS — I712 Thoracic aortic aneurysm, without rupture, unspecified: Secondary | ICD-10-CM

## 2018-10-23 DIAGNOSIS — I2510 Atherosclerotic heart disease of native coronary artery without angina pectoris: Secondary | ICD-10-CM | POA: Diagnosis not present

## 2018-10-23 DIAGNOSIS — K754 Autoimmune hepatitis: Secondary | ICD-10-CM

## 2018-10-23 DIAGNOSIS — Z7189 Other specified counseling: Secondary | ICD-10-CM

## 2018-10-23 DIAGNOSIS — D500 Iron deficiency anemia secondary to blood loss (chronic): Secondary | ICD-10-CM | POA: Diagnosis not present

## 2018-10-23 DIAGNOSIS — I517 Cardiomegaly: Secondary | ICD-10-CM | POA: Diagnosis not present

## 2018-10-23 NOTE — Progress Notes (Signed)
Cardiology Office Note:    Date:  10/23/2018   ID:  Corey Shannon, DOB [birth year], MRN [id number]  PCP:  [name], MD  Cardiologist:  No primary care provider on file.   Referring MD: [name], MD   Chief Complaint  Patient presents with  . Thoracic Aortic Aneurysm  . Coronary Artery Disease    History of Present Illness:    Corey Shannon is a 68 y.o. male with a hx of CP, dyspnea on exertion, syncope, and hyperlipidemia.Coincidentally identified aortic root enlargement (4.2 cm), coronary calcification, and severe left ventricular hypertrophy. Recent cath with widely patent coronary arteries July 2019 was performed because of exertional dyspnea and atypical chest pain.  He has no cardiac complaints.  He is now living in [place], [place] place [place]  He has not had any cardiac symptoms.  He is being worked up for liver transplant at [facility].  They anticipate a liver transplant may be done sometime in January to May.  He will need to live in [place] awaiting a liver transplant event for several months thereafter.  Past Medical History:  Diagnosis Date  . Arthritis   . Autoimmune hepatitis ([place]) dx 2005   secondary to gluten allergy  . GERD (gastroesophageal reflux disease)   . Gluten intolerance   . History of nuclear stress test    Myoview 1/17: EF 61%, inferior and inferolateral ischemia, low risk  . Hyperlipidemia   . Pneumonia     Past Surgical History:  Procedure Laterality Date  . BACK SURGERY    . CHOLECYSTECTOMY    . ESOPHAGEAL BANDING N/A 11/29/2012   Procedure: ESOPHAGEAL BANDING;  Surgeon: Beryle Beams, MD;  Location: [facility];  Service: Endoscopy;  Laterality: N/A;  . ESOPHAGEAL BANDING N/A 01/24/2013   Procedure: ESOPHAGEAL BANDING;  Surgeon: Beryle Beams, MD;  Location: [facility];  Service: Endoscopy;  Laterality: N/A;  . ESOPHAGEAL BANDING N/A 02/21/2013   Procedure: ESOPHAGEAL BANDING;  Surgeon: Beryle Beams, MD;  Location: [facility];  Service: Endoscopy;  Laterality: N/A;  . ESOPHAGEAL BANDING N/A 03/20/2014   Procedure: ESOPHAGEAL BANDING;  Surgeon: Beryle Beams, MD;  Location: [facility];  Service: Endoscopy;  Laterality: N/A;  . ESOPHAGOGASTRODUODENOSCOPY N/A 11/29/2012   Procedure: ESOPHAGOGASTRODUODENOSCOPY (EGD);  Surgeon: Beryle Beams, MD;  Location: [facility];  Service: Endoscopy;  Laterality: N/A;  . ESOPHAGOGASTRODUODENOSCOPY N/A 01/24/2013   Procedure: ESOPHAGOGASTRODUODENOSCOPY (EGD);  Surgeon: Beryle Beams, MD;  Location: [facility];  Service: Endoscopy;  Laterality: N/A;  . ESOPHAGOGASTRODUODENOSCOPY N/A 02/21/2013   Procedure: ESOPHAGOGASTRODUODENOSCOPY (EGD);  Surgeon: Beryle Beams, MD;  Location: [facility];  Service: Endoscopy;  Laterality: N/A;  . ESOPHAGOGASTRODUODENOSCOPY (EGD) WITH PROPOFOL N/A 03/20/2014   Procedure: ESOPHAGOGASTRODUODENOSCOPY (EGD) WITH PROPOFOL;  Surgeon: Beryle Beams, MD;  Location: [facility];  Service: Endoscopy;  Laterality: N/A;  . LEFT HEART CATH AND CORONARY ANGIOGRAPHY N/A 09/13/2017   Procedure: LEFT HEART CATH AND CORONARY ANGIOGRAPHY;  Surgeon: Belva Crome, MD;  Location: [facility];  Service: Cardiovascular;  Laterality: N/A;  . SPINE SURGERY     x 4, 9 disc fusion    Current Medications: Current Meds  Medication Sig  . alfuzosin (UROXATRAL) 10 MG 24 hr tablet Take 10 mg by mouth daily with breakfast.  . amoxicillin (AMOXIL) 500 MG capsule Take 500 mg by mouth 4 (four) times daily.  Marland Kitchen atorvastatin (LIPITOR)  20 MG tablet Take 20 mg by mouth daily.  Marland Kitchen azaTHIOprine (IMURAN) 50 MG tablet Take 100 mg by mouth daily with breakfast.   . Calcium Carb-Cholecalciferol (CALCIUM 1000 + D) 1000-800 MG-UNIT TABS Take 1 tablet by mouth daily.  . fish oil-omega-3 fatty acids 1000 MG capsule Take 2 g by mouth daily.  . furosemide (LASIX) 40 MG tablet Take 40 mg by mouth  daily.  . nadolol (CORGARD) 40 MG tablet Take 40 mg by mouth daily with breakfast.   . RABEprazole (ACIPHEX) 20 MG tablet Take 20 mg by mouth daily.  Marland Kitchen spironolactone (ALDACTONE) 50 MG tablet Take 50 mg by mouth daily.  . tadalafil (CIALIS) 5 MG tablet Take 5 mg by mouth daily.      Allergies:   Latex, Shellfish allergy, Gluten, Sulfa antibiotics, and Other   Social History   Socioeconomic History  . Marital status: Married    Spouse name: Not on file  . Number of children: Not on file  . Years of education: Not on file  . Highest education level: Not on file  Occupational History  . Not on file  Social Needs  . Financial resource strain: Not on file  . Food insecurity    Worry: Not on file    Inability: Not on file  . Transportation needs    Medical: Not on file    Non-medical: Not on file  Tobacco Use  . Smoking status: Never Smoker  . Smokeless tobacco: Never Used  Substance and Sexual Activity  . Alcohol use: Yes    Comment: wine weekly-3 glasses  . Drug use: No  . Sexual activity: Not on file  Lifestyle  . Physical activity    Days per week: Not on file    Minutes per session: Not on file  . Stress: Not on file  Relationships  . Social Herbalist on phone: Not on file    Gets together: Not on file    Attends religious service: Not on file    Active member of club or organization: Not on file    Attends meetings of clubs or organizations: Not on file    Relationship status: Not on file  Other Topics Concern  . Not on file  Social History Narrative  . Not on file     Family History: The patient's family history includes Alzheimer's disease in his father; Heart attack in his father; Heart disease in his father; Osteoporosis in his mother; Stroke in his father.  ROS:   Please see the history of present illness.    He has lost weight.  Still has ascites.  He is still physically active.  He tries to go fishing.  He has particular and steadfast about  shoulder distancing.  All other systems reviewed and are negative.  EKGs/Labs/Other Studies Reviewed:    The following studies were reviewed today: 2D Doppler echocardiogram August 13, 2018 IMPRESSIONS    1. The left ventricle has normal systolic function, with an ejection fraction of 55-60%. The cavity size was normal. Left ventricular diastolic parameters were normal. No evidence of left ventricular regional wall motion abnormalities.  2. The right ventricle has normal systolic function. The cavity was normal. There is no increase in right ventricular wall thickness. Right ventricular systolic pressure could not be assessed.  3. There is mild dilatation of the aortic root and of the descending aorta.  EKG:  EKG is bradycardia, 53 bpm, and otherwise normal in appearance  Recent Labs: 09/27/2018: Hemoglobin 11.1; Platelet Count 69  Recent Lipid Panel    Component Value Date/Time   CHOL  05/04/2009 0445    95        ATP III CLASSIFICATION:  <200     mg/dL   Desirable  200-239  mg/dL   Borderline High  >=240    mg/dL   High          TRIG 43 05/04/2009 0445   HDL 25 (L) 05/04/2009 0445   CHOLHDL 3.8 05/04/2009 0445   VLDL 9 05/04/2009 0445   LDLCALC  05/04/2009 0445    61        Total Cholesterol/HDL:CHD Risk Coronary Heart Disease Risk Table                     Men   Women  1/2 Average Risk   3.4   3.3  Average Risk       5.0   4.4  2 X Average Risk   9.6   7.1  3 X Average Risk  23.4   11.0        Use the calculated Patient Ratio above and the CHD Risk Table to determine the patient's CHD Risk.        ATP III CLASSIFICATION (LDL):  <100     mg/dL   Optimal  100-129  mg/dL   Near or Above                    Optimal  130-159  mg/dL   Borderline  160-189  mg/dL   High  >190     mg/dL   Very High    Physical Exam:    VS:  BP 102/68   Pulse (!) 53   Ht 6' 4"  (1.93 m)   Wt 240 lb (108.9 kg)   SpO2 98%   BMI 29.21 kg/m     Wt Readings from Last 3 Encounters:   10/23/18 240 lb (108.9 kg)  09/27/18 254 lb 14.4 oz (115.6 kg)  12/25/17 275 lb 1.6 oz (124.8 kg)     GEN: He is chronically ill with slight appearing skin color.. No acute distress HEENT: Normal NECK: No JVD. LYMPHATICS: No lymphadenopathy CARDIAC:  RRR without murmur, gallop, or edema. VASCULAR:  Normal Pulses. No bruits. RESPIRATORY:  Clear to auscultation without rales, wheezing or rhonchi  ABDOMEN: Tender abdomen with moderate ascites., MUSCULOSKELETAL: No deformity  SKIN: Warm and dry NEUROLOGIC:  Alert and oriented x 3 PSYCHIATRIC:  Normal affect   ASSESSMENT:    1. Thoracic aortic aneurysm without rupture ([place])   2. Left ventricular hypertrophy   3. Iron deficiency anemia due to chronic blood loss   4. Autoimmune hepatitis ([place])   5. Coronary artery calcification seen on CAT scan   6. Educated About Covid-19 Virus Infection    PLAN:    In order of problems listed above:  1. Aorta will be follow-up.  Neck study will be a CT scan in 1 year to accurately assess the aortic size.  He is not on ARB therapy and has low normal BP due to [place] which is causing significant but asymptomatic bradycardia.  No signs of volume overload or heart failure. 2. Not addressed 3. Preparing for liver transplant 4. Secondary risk prevention including lipid management and blood pressure control is revisited. 5. Social distancing, masking, and handwashing and stress.   Medication Adjustments/Labs and Tests Ordered: Current medicines are reviewed at length  with the patient today.  Concerns regarding medicines are outlined above.  Orders Placed This Encounter  Procedures  . EKG 12-Lead   No orders of the defined types were placed in this encounter.   Patient Instructions  Medication Instructions:  Your physician recommends that you continue on your current medications as directed. Please refer to the Current Medication list given to you today.  If you need a refill on your cardiac  medications before your next appointment, please call your pharmacy.   Lab work: None If you have labs (blood work) drawn today and your tests are completely normal, you will receive your results only by: Marland Kitchen MyChart Message (if you have MyChart) OR . A paper copy in the mail If you have any lab test that is abnormal or we need to change your treatment, we will call you to review the results.  Testing/Procedures: None  Follow-Up: At [facility], you and your health needs are our priority.  As part of our continuing mission to provide you with exceptional heart care, we have created designated Provider Care Teams.  These Care Teams include your primary Cardiologist (physician) and Advanced Practice Providers (APPs -  Physician Assistants and Nurse Practitioners) who all work together to provide you with the care you need, when you need it. You will need a follow up appointment in 12 months.  Please call our office 2 months in advance to schedule this appointment.  You may see Dr. Tamala Julian or one of the following Advanced Practice Providers on your designated Care Team:   Truitt Merle, NP Cecilie Kicks, NP . Kathyrn Drown, NP  Any Other Special Instructions Will Be Listed Below (If Applicable).       Signed, Sinclair Grooms, MD  10/23/2018 3:27 PM    JAARS

## 2018-10-23 NOTE — Patient Instructions (Signed)
Medication Instructions:  Your physician recommends that you continue on your current medications as directed. Please refer to the Current Medication list given to you today.  If you need a refill on your cardiac medications before your next appointment, please call your pharmacy.   Lab work: None If you have labs (blood work) drawn today and your tests are completely normal, you will receive your results only by: Marland Kitchen MyChart Message (if you have MyChart) OR . A paper copy in the mail If you have any lab test that is abnormal or we need to change your treatment, we will call you to review the results.  Testing/Procedures: None  Follow-Up: At [facility], you and your health needs are our priority.  As part of our continuing mission to provide you with exceptional heart care, we have created designated Provider Care Teams.  These Care Teams include your primary Cardiologist (physician) and Advanced Practice Providers (APPs -  Physician Assistants and Nurse Practitioners) who all work together to provide you with the care you need, when you need it. You will need a follow up appointment in 12 months.  Please call our office 2 months in advance to schedule this appointment.  You may see Dr. Tamala Julian or one of the following Advanced Practice Providers on your designated Care Team:   Truitt Merle, NP Cecilie Kicks, NP . Kathyrn Drown, NP  Any Other Special Instructions Will Be Listed Below (If Applicable).

## 2018-12-05 ENCOUNTER — Ambulatory Visit: Payer: Medicare Other | Admitting: Interventional Cardiology

## 2019-01-21 IMAGING — CT CT ANGIO CHEST
2 of 7 series · 19 of 36 positions shown · IV contrast (isovue)
Comparison: CT scan of March 12, 2015.

CLINICAL DATA: Thoracic aortic aneurysm without rupture.

EXAM:
CT ANGIOGRAPHY CHEST WITH CONTRAST
TECHNIQUE: Multidetector CT imaging of the chest was performed using the
standard protocol during bolus administration of intravenous
contrast. Multiplanar CT image reconstructions and MIPs were
obtained to evaluate the vascular anatomy.
CONTRAST:  100 mL of Isovue 370 intravenously.

[Series 4: aorta 3.0 i31f 2 · axial · 0.85mm/px · z∈[-372,-57]mm · 18 of 116 slices shown]
[im 6/116  lung]
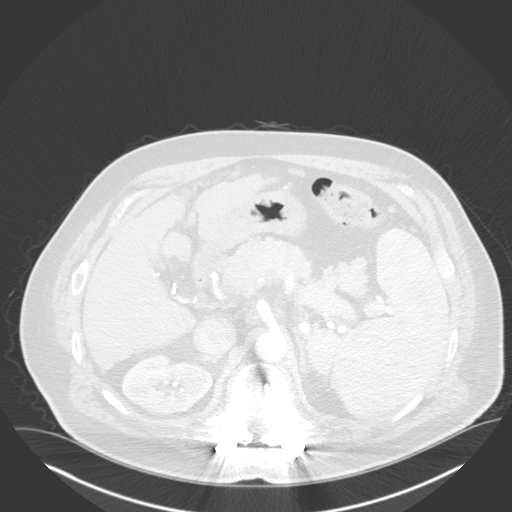
[im 11/116  mediastinal]
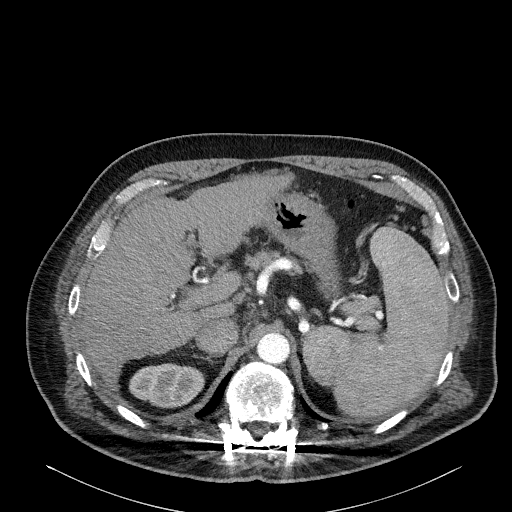
[im 21/116  lung]
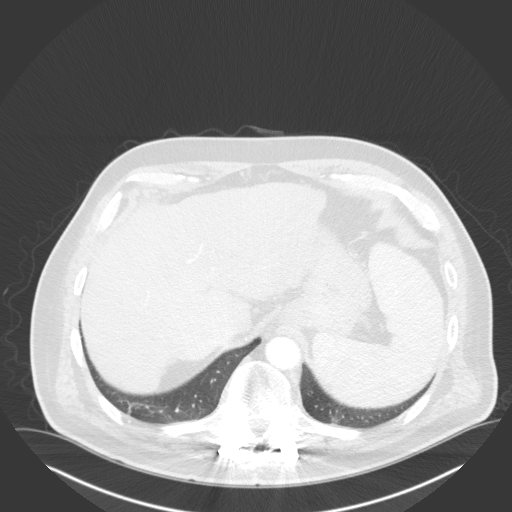
[im 26/116  mediastinal]
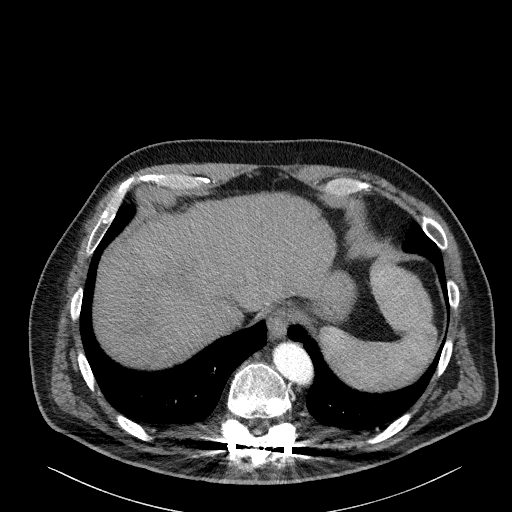
[im 31/116  lung]
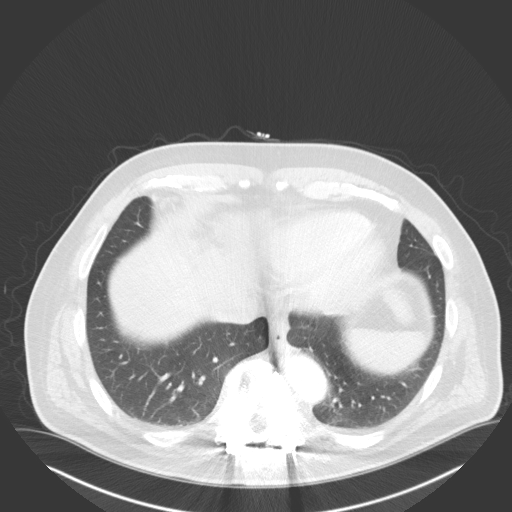
[im 36/116  mediastinal]
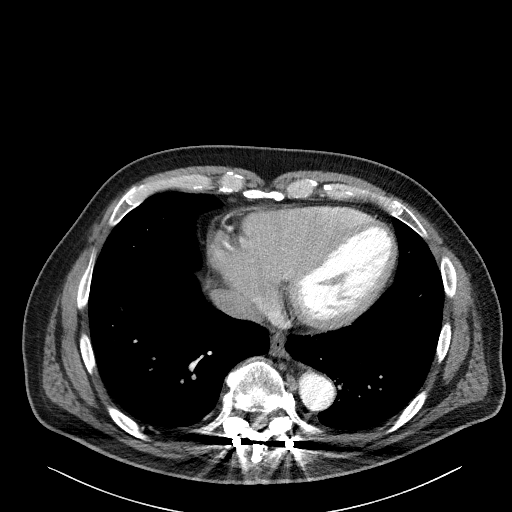
[im 41/116  lung]
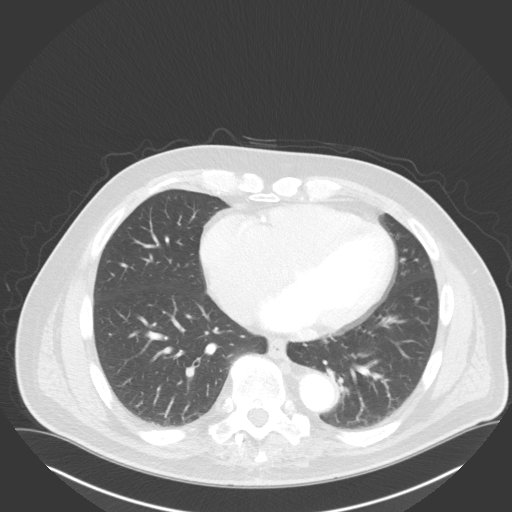
[im 51/116  mediastinal]
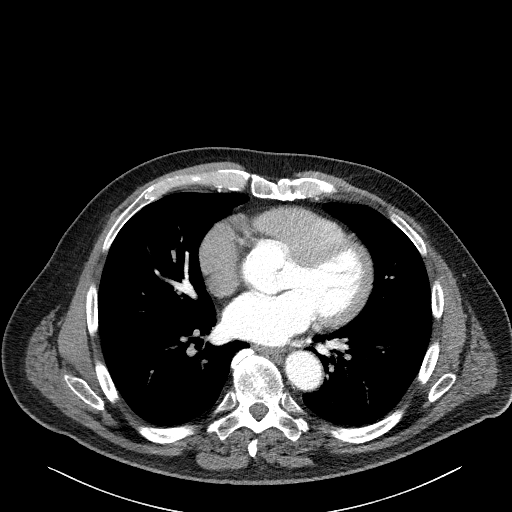
[im 56/116  lung]
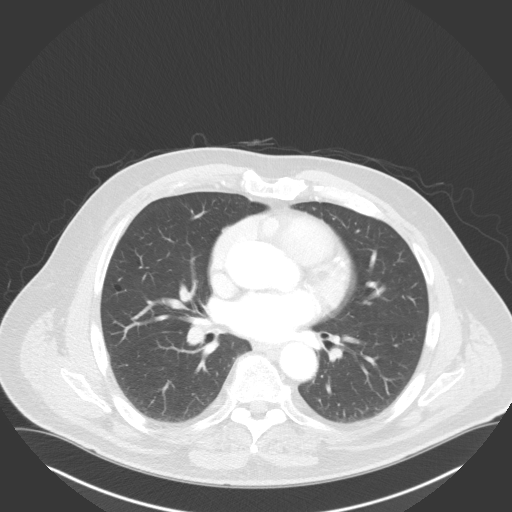
[im 61/116  mediastinal]
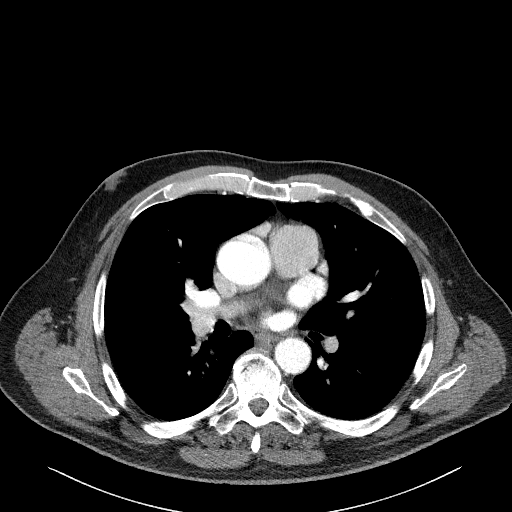
[im 66/116  lung]
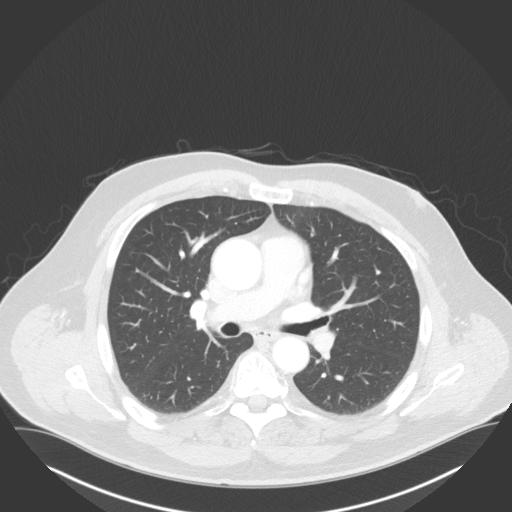
[im 76/116  mediastinal]
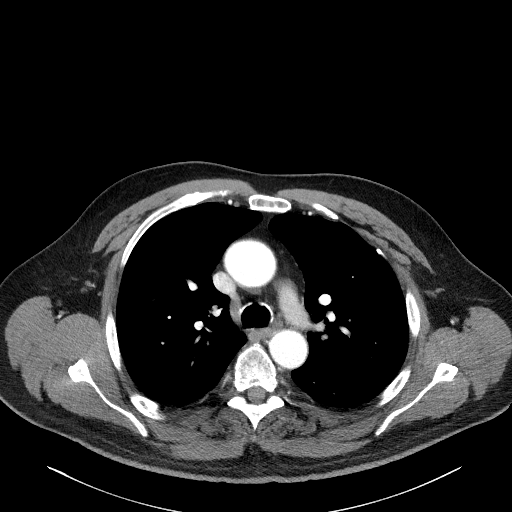
[im 81/116  lung]
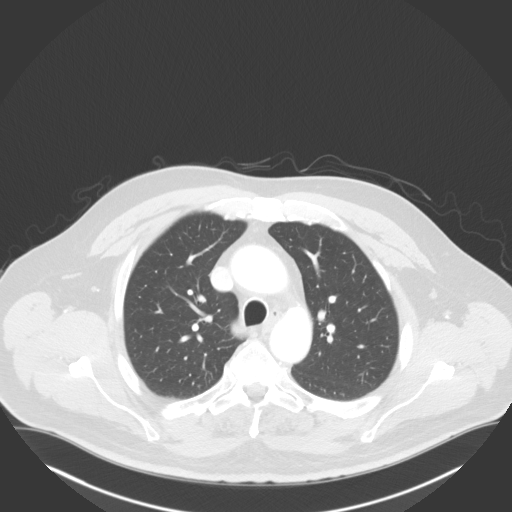
[im 86/116  mediastinal]
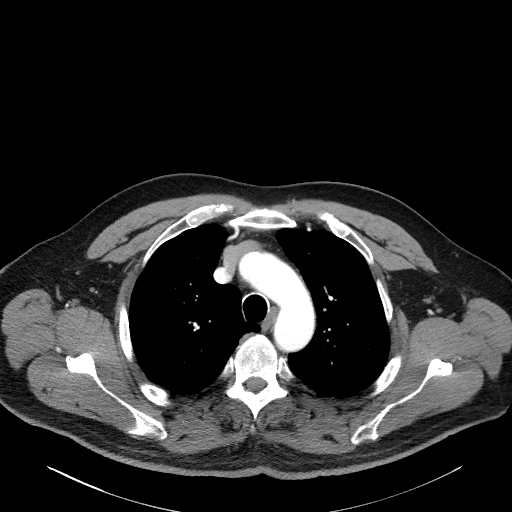
[im 91/116  lung]
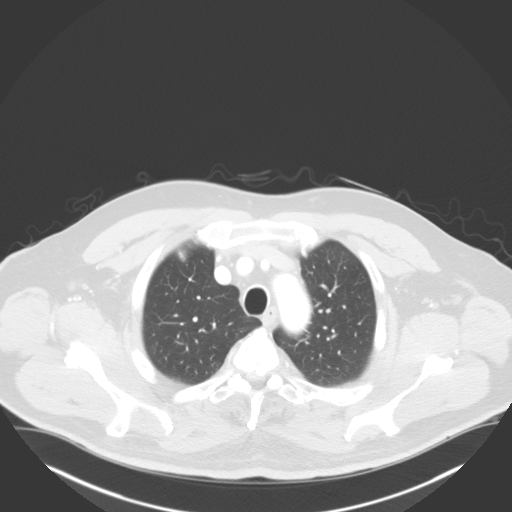
[im 96/116  mediastinal]
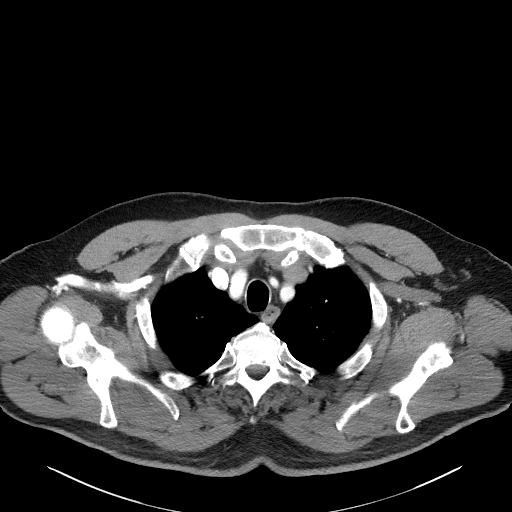
[im 106/116  lung]
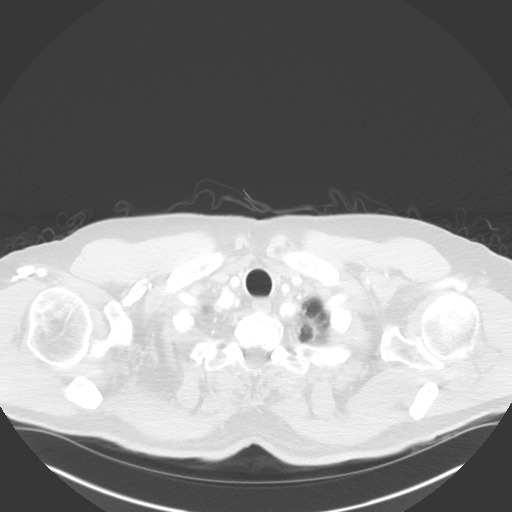
[im 111/116  mediastinal]
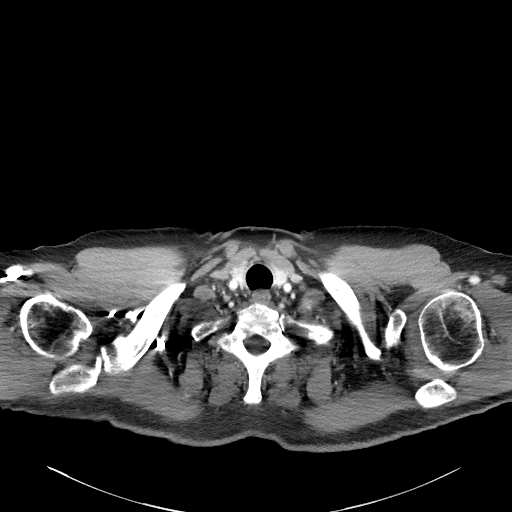

[Series 7: coronals · coronal · 0.69mm/px · 1 of 128 slices shown]
[im 64/128  mediastinal]
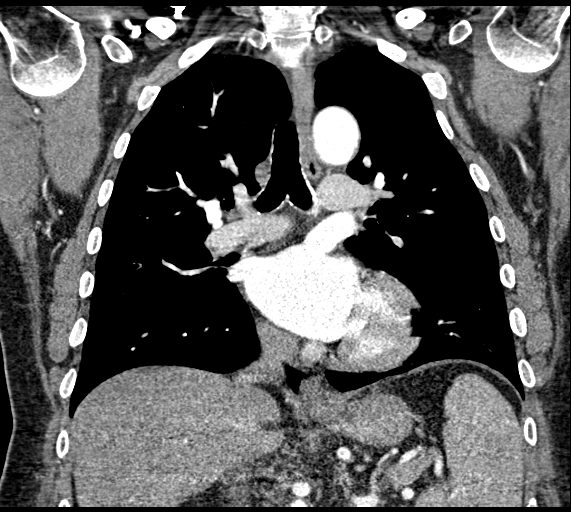

[19 of 36 positions shown; findings below may reference images not displayed]

FINDINGS: Cardiovascular: Atherosclerosis of thoracic aorta is noted without
dissection. Stable 4.2 cm ascending thoracic aortic aneurysm is
noted. Transverse aortic arch measures 3.0 cm. Descending thoracic
aorta measures 2.9 cm. Coronary artery calcifications are noted
suggesting coronary artery disease.

Mediastinum/Nodes: No enlarged mediastinal, hilar, or axillary lymph
nodes. Thyroid gland, trachea, and esophagus demonstrate no
significant findings.

Lungs/Pleura: Lungs are clear. No pleural effusion or pneumothorax.

Upper Abdomen: Hepatic cirrhosis is noted with splenomegaly
consistent with portal hypertension

Musculoskeletal: Postsurgical changes are seen involving the lower
thoracic spine.

Review of the MIP images confirms the above findings.
IMPRESSION: Stable 4.2 cm ascending thoracic aortic aneurysm. Recommend annual
imaging followup by CTA or MRA. This recommendation follows 0383
ACCF/AHA/AATS/ACR/ASA/SCA/LINDLAY/CINTAKU/MERLOS/MARI-LIZE Guidelines for the
Diagnosis and Management of Patients with Thoracic Aortic Disease.
Circulation. 0383; 121: [id number].

Coronary artery calcifications are noted suggesting coronary artery
disease.

Hepatic cirrhosis with splenomegaly is noted consistent portal
hypertension.

## 2019-03-17 NOTE — Progress Notes (Signed)
Cardiology Office Note:    Date:  03/18/2019   ID:  Corey Shannon, DOB [birth year], MRN [id number]  PCP:  [name], MD  Cardiologist:  Sinclair Grooms, MD   Referring MD: [name], MD   Chief Complaint  Patient presents with  . Irregular Heart Beat    PSVT 48 hours ago requiring adenosine    History of Present Illness:    DEDRICK Shannon is a 69 y.o. male with a hx of CP,dyspnea on exertion,syncope, and hyperlipidemia.Coincidentally identified aortic root enlargement (4.2 cm), coronary calcification, and severe left ventricular hypertrophy.Recent cath with widely patent coronary arteries July 2019was performed because of exertional dyspnea and atypical chest pain.  Came out of concern that he developed PSVT, it was prolonged and associated with palpitations and weakness, and required Identicard x2 to break.  He was seen in the emergency room in [facility].  He says his chest is still sore.  He did not have electrical cardioversion.  EKG post conversion did not reveal any evidence of ischemia.  He has normal PR interval when in sinus rhythm.  Past Medical History:  Diagnosis Date  . Arthritis   . Autoimmune hepatitis ([place]) dx 2005   secondary to gluten allergy  . GERD (gastroesophageal reflux disease)   . Gluten intolerance   . History of nuclear stress test    Myoview 1/17: EF 61%, inferior and inferolateral ischemia, low risk  . Hyperlipidemia   . Pneumonia     Past Surgical History:  Procedure Laterality Date  . BACK SURGERY    . CHOLECYSTECTOMY    . ESOPHAGEAL BANDING N/A 11/29/2012   Procedure: ESOPHAGEAL BANDING;  Surgeon: Beryle Beams, MD;  Location: [facility];  Service: Endoscopy;  Laterality: N/A;  . ESOPHAGEAL BANDING N/A 01/24/2013   Procedure: ESOPHAGEAL BANDING;  Surgeon: Beryle Beams, MD;  Location: [facility];  Service: Endoscopy;  Laterality: N/A;  . ESOPHAGEAL BANDING N/A 02/21/2013   Procedure: ESOPHAGEAL BANDING;   Surgeon: Beryle Beams, MD;  Location: [facility];  Service: Endoscopy;  Laterality: N/A;  . ESOPHAGEAL BANDING N/A 03/20/2014   Procedure: ESOPHAGEAL BANDING;  Surgeon: Beryle Beams, MD;  Location: [facility];  Service: Endoscopy;  Laterality: N/A;  . ESOPHAGOGASTRODUODENOSCOPY N/A 11/29/2012   Procedure: ESOPHAGOGASTRODUODENOSCOPY (EGD);  Surgeon: Beryle Beams, MD;  Location: [facility];  Service: Endoscopy;  Laterality: N/A;  . ESOPHAGOGASTRODUODENOSCOPY N/A 01/24/2013   Procedure: ESOPHAGOGASTRODUODENOSCOPY (EGD);  Surgeon: Beryle Beams, MD;  Location: [facility];  Service: Endoscopy;  Laterality: N/A;  . ESOPHAGOGASTRODUODENOSCOPY N/A 02/21/2013   Procedure: ESOPHAGOGASTRODUODENOSCOPY (EGD);  Surgeon: Beryle Beams, MD;  Location: [facility];  Service: Endoscopy;  Laterality: N/A;  . ESOPHAGOGASTRODUODENOSCOPY (EGD) WITH PROPOFOL N/A 03/20/2014   Procedure: ESOPHAGOGASTRODUODENOSCOPY (EGD) WITH PROPOFOL;  Surgeon: Beryle Beams, MD;  Location: [facility];  Service: Endoscopy;  Laterality: N/A;  . LEFT HEART CATH AND CORONARY ANGIOGRAPHY N/A 09/13/2017   Procedure: LEFT HEART CATH AND CORONARY ANGIOGRAPHY;  Surgeon: Belva Crome, MD;  Location: [facility];  Service: Cardiovascular;  Laterality: N/A;  . SPINE SURGERY     x 4, 9 disc fusion    Current Medications: Current Meds  Medication Sig  . alfuzosin (UROXATRAL) 10 MG 24 hr tablet Take 10 mg by mouth daily with breakfast.  . atorvastatin (LIPITOR) 20 MG tablet Take 20 mg by mouth daily.  Marland Kitchen azaTHIOprine (IMURAN) 50 MG tablet Take 100 mg by mouth daily with breakfast.   .  Calcium Carb-Cholecalciferol (CALCIUM 1000 + D) 1000-800 MG-UNIT TABS Take 1 tablet by mouth daily.  . fish oil-omega-3 fatty acids 1000 MG capsule Take 2 g by mouth daily.  . furosemide (LASIX) 40 MG tablet Take 40 mg by mouth daily.  . nadolol (CORGARD) 40 MG tablet Take 40 mg by mouth daily with breakfast.   . RABEprazole (ACIPHEX) 20 MG  tablet Take 20 mg by mouth daily.  Marland Kitchen spironolactone (ALDACTONE) 50 MG tablet Take 50 mg by mouth daily.  . tadalafil (CIALIS) 5 MG tablet Take 5 mg by mouth daily.      Allergies:   Latex, Shellfish allergy, Gluten, Sulfa antibiotics, and Other   Social History   Socioeconomic History  . Marital status: Married    Spouse name: Not on file  . Number of children: Not on file  . Years of education: Not on file  . Highest education level: Not on file  Occupational History  . Not on file  Tobacco Use  . Smoking status: Never Smoker  . Smokeless tobacco: Never Used  Substance and Sexual Activity  . Alcohol use: Yes    Comment: wine weekly-3 glasses  . Drug use: No  . Sexual activity: Not on file  Other Topics Concern  . Not on file  Social History Narrative  . Not on file   Social Determinants of Health   Financial Resource Strain:   . Difficulty of Paying Living Expenses: Not on file  Food Insecurity:   . Worried About Charity fundraiser in the Last Year: Not on file  . Ran Out of Food in the Last Year: Not on file  Transportation Needs:   . Lack of Transportation (Medical): Not on file  . Lack of Transportation (Non-Medical): Not on file  Physical Activity:   . Days of Exercise per Week: Not on file  . Minutes of Exercise per Session: Not on file  Stress:   . Feeling of Stress : Not on file  Social Connections:   . Frequency of Communication with Friends and Family: Not on file  . Frequency of Social Gatherings with Friends and Family: Not on file  . Attends Religious Services: Not on file  . Active Member of Clubs or Organizations: Not on file  . Attends Archivist Meetings: Not on file  . Marital Status: Not on file     Family History: The patient's family history includes Alzheimer's disease in his father; Heart attack in his father; Heart disease in his father; Osteoporosis in his mother; Stroke in his father.  ROS:   Please see the history of  present illness.    Battling cirrhosis of the liver.  He is on the transplant list at [facility].  All other systems reviewed and are negative.  EKGs/Labs/Other Studies Reviewed:    The following studies were reviewed today: No new cardiac data.  EKG:  EKG EKG post cardioversion at [facility] demonstrated normal sinus rhythm at 69 bpm, normal PR interval without ischemic changes.  Recent Labs: 09/27/2018: Hemoglobin 11.1; Platelet Count 69  Recent Lipid Panel    Component Value Date/Time   CHOL  05/04/2009 0445    95        ATP III CLASSIFICATION:  <200     mg/dL   Desirable  200-239  mg/dL   Borderline High  >=240    mg/dL   High          TRIG 43 05/04/2009 0445  HDL 25 (L) 05/04/2009 0445   CHOLHDL 3.8 05/04/2009 0445   VLDL 9 05/04/2009 0445   LDLCALC  05/04/2009 0445    61        Total Cholesterol/HDL:CHD Risk Coronary Heart Disease Risk Table                     Men   Women  1/2 Average Risk   3.4   3.3  Average Risk       5.0   4.4  2 X Average Risk   9.6   7.1  3 X Average Risk  23.4   11.0        Use the calculated Patient Ratio above and the CHD Risk Table to determine the patient's CHD Risk.        ATP III CLASSIFICATION (LDL):  <100     mg/dL   Optimal  100-129  mg/dL   Near or Above                    Optimal  130-159  mg/dL   Borderline  160-189  mg/dL   High  >190     mg/dL   Very High    Physical Exam:    VS:  BP 104/62   Pulse 63   Ht 6' 4"  (1.93 m)   Wt 249 lb 12.8 oz (113.3 kg)   SpO2 99%   BMI 30.41 kg/m     Wt Readings from Last 3 Encounters:  03/18/19 249 lb 12.8 oz (113.3 kg)  10/23/18 240 lb (108.9 kg)  09/27/18 254 lb 14.4 oz (115.6 kg)     GEN: Appears chronically ill.  Skin color is slight gray.. No acute distress HEENT: Normal NECK: No JVD. LYMPHATICS: No lymphadenopathy CARDIAC:  RRR without murmur, gallop, or edema. VASCULAR:  Normal Pulses. No bruits. RESPIRATORY:  Clear to auscultation  without rales, wheezing or rhonchi  ABDOMEN: Soft, non-tender, non-distended, No pulsatile mass, MUSCULOSKELETAL: No deformity  SKIN: Warm and dry NEUROLOGIC:  Alert and oriented x 3 PSYCHIATRIC:  Normal affect   ASSESSMENT:    1. PSVT (paroxysmal supraventricular tachycardia) ([place])   2. Thoracic aortic aneurysm without rupture ([place])   3. Autoimmune hepatitis ([place])   4. Left ventricular hypertrophy   5. HYPERCHOLESTEROLEMIA   6. Educated about COVID-19 virus infection    PLAN:    In order of problems listed above:  1. Broke with adenticard, requiring 12 mg IV after the 6 mg dose failed.  No recurrence of arrhythmia since that time.  I have recommended no specific change.  If he has recurrence we will need to uptitrate beta-blocker intensity or perhaps add additional therapy. 2. He will follow-up in August for thoracic aortic aneurysm 3. He is still on the transplant list but probably will not require/receive transplant this year. 4. Left ventricular hypertrophy is not reassessed.  He wanted to know if this could predispose to PSVT and I counseled against that theory. 5. Not discussed 6. Covid vaccine is desired.  He is practicing the 3 double use.   Medication Adjustments/Labs and Tests Ordered: Current medicines are reviewed at length with the patient today.  Concerns regarding medicines are outlined above.  No orders of the defined types were placed in this encounter.  No orders of the defined types were placed in this encounter.   Patient Instructions  Medication Instructions:  Your physician recommends that you continue on your current medications as directed. Please refer  to the Current Medication list given to you today.  *If you need a refill on your cardiac medications before your next appointment, please call your pharmacy*  Lab Work: None If you have labs (blood work) drawn today and your tests are completely normal, you will receive your results only  by: Marland Kitchen MyChart Message (if you have MyChart) OR . A paper copy in the mail If you have any lab test that is abnormal or we need to change your treatment, we will call you to review the results.  Testing/Procedures: None  Follow-Up: At [facility], you and your health needs are our priority.  As part of our continuing mission to provide you with exceptional heart care, we have created designated Provider Care Teams.  These Care Teams include your primary Cardiologist (physician) and Advanced Practice Providers (APPs -  Physician Assistants and Nurse Practitioners) who all work together to provide you with the care you need, when you need it.  Your next appointment:   7 month(s)  The format for your next appointment:   In Person  Provider:   You may see Dr. Daneen Schick or one of the following Advanced Practice Providers on your designated Care Team:    Truitt Merle, NP  Cecilie Kicks, NP  Kathyrn Drown, NP   Other Instructions      Signed, Sinclair Grooms, MD  03/18/2019 4:09 PM    [place]

## 2019-03-18 ENCOUNTER — Ambulatory Visit (INDEPENDENT_AMBULATORY_CARE_PROVIDER_SITE_OTHER): Payer: Medicare Other | Admitting: Interventional Cardiology

## 2019-03-18 ENCOUNTER — Encounter: Payer: Self-pay | Admitting: Interventional Cardiology

## 2019-03-18 ENCOUNTER — Other Ambulatory Visit: Payer: Self-pay

## 2019-03-18 DIAGNOSIS — I471 Supraventricular tachycardia: Secondary | ICD-10-CM

## 2019-03-18 DIAGNOSIS — I712 Thoracic aortic aneurysm, without rupture, unspecified: Secondary | ICD-10-CM

## 2019-03-18 DIAGNOSIS — I517 Cardiomegaly: Secondary | ICD-10-CM | POA: Diagnosis not present

## 2019-03-18 DIAGNOSIS — Z7189 Other specified counseling: Secondary | ICD-10-CM

## 2019-03-18 DIAGNOSIS — K754 Autoimmune hepatitis: Secondary | ICD-10-CM

## 2019-03-18 DIAGNOSIS — E7800 Pure hypercholesterolemia, unspecified: Secondary | ICD-10-CM

## 2019-03-18 NOTE — Patient Instructions (Signed)
Medication Instructions:  Your physician recommends that you continue on your current medications as directed. Please refer to the Current Medication list given to you today.  *If you need a refill on your cardiac medications before your next appointment, please call your pharmacy*  Lab Work: None If you have labs (blood work) drawn today and your tests are completely normal, you will receive your results only by: Marland Kitchen MyChart Message (if you have MyChart) OR . A paper copy in the mail If you have any lab test that is abnormal or we need to change your treatment, we will call you to review the results.  Testing/Procedures: None  Follow-Up: At [facility], you and your health needs are our priority.  As part of our continuing mission to provide you with exceptional heart care, we have created designated Provider Care Teams.  These Care Teams include your primary Cardiologist (physician) and Advanced Practice Providers (APPs -  Physician Assistants and Nurse Practitioners) who all work together to provide you with the care you need, when you need it.  Your next appointment:   7 month(s)  The format for your next appointment:   In Person  Provider:   You may see Dr. Daneen Schick or one of the following Advanced Practice Providers on your designated Care Team:    Truitt Merle, NP  Cecilie Kicks, NP  Kathyrn Drown, NP   Other Instructions

## 2019-04-02 ENCOUNTER — Inpatient Hospital Stay: Payer: Medicare Other

## 2019-04-02 ENCOUNTER — Other Ambulatory Visit: Payer: Self-pay

## 2019-04-02 ENCOUNTER — Inpatient Hospital Stay: Payer: Medicare Other | Attending: Oncology | Admitting: Oncology

## 2019-04-02 VITALS — BP 107/64 | HR 58 | Temp 99.1°F | Resp 16 | Ht 76.0 in | Wt 237.5 lb

## 2019-04-02 DIAGNOSIS — T8090XA Unspecified complication following infusion and therapeutic injection, initial encounter: Secondary | ICD-10-CM | POA: Diagnosis not present

## 2019-04-02 DIAGNOSIS — K7460 Unspecified cirrhosis of liver: Secondary | ICD-10-CM | POA: Diagnosis present

## 2019-04-02 DIAGNOSIS — D500 Iron deficiency anemia secondary to blood loss (chronic): Secondary | ICD-10-CM | POA: Insufficient documentation

## 2019-04-02 DIAGNOSIS — D696 Thrombocytopenia, unspecified: Secondary | ICD-10-CM | POA: Insufficient documentation

## 2019-04-02 DIAGNOSIS — K766 Portal hypertension: Secondary | ICD-10-CM | POA: Insufficient documentation

## 2019-04-02 DIAGNOSIS — D509 Iron deficiency anemia, unspecified: Secondary | ICD-10-CM

## 2019-04-02 LAB — CBC WITH DIFFERENTIAL (CANCER CENTER ONLY)
Abs Immature Granulocytes: 0.01 10*3/uL (ref 0.00–0.07)
Basophils Absolute: 0 10*3/uL (ref 0.0–0.1)
Basophils Relative: 1 %
Eosinophils Absolute: 0.3 10*3/uL (ref 0.0–0.5)
Eosinophils Relative: 8 %
HCT: 34.8 % — ABNORMAL LOW (ref 39.0–52.0)
Hemoglobin: 11.6 g/dL — ABNORMAL LOW (ref 13.0–17.0)
Immature Granulocytes: 0 %
Lymphocytes Relative: 20 %
Lymphs Abs: 0.7 10*3/uL (ref 0.7–4.0)
MCH: 33.5 pg (ref 26.0–34.0)
MCHC: 33.3 g/dL (ref 30.0–36.0)
MCV: 100.6 fL — ABNORMAL HIGH (ref 80.0–100.0)
Monocytes Absolute: 0.5 10*3/uL (ref 0.1–1.0)
Monocytes Relative: 14 %
Neutro Abs: 2 10*3/uL (ref 1.7–7.7)
Neutrophils Relative %: 57 %
Platelet Count: 105 10*3/uL — ABNORMAL LOW (ref 150–400)
RBC: 3.46 MIL/uL — ABNORMAL LOW (ref 4.22–5.81)
RDW: 18.1 % — ABNORMAL HIGH (ref 11.5–15.5)
WBC Count: 3.5 10*3/uL — ABNORMAL LOW (ref 4.0–10.5)
nRBC: 0 % (ref 0.0–0.2)

## 2019-04-02 LAB — FERRITIN: Ferritin: 20 ng/mL — ABNORMAL LOW (ref 24–336)

## 2019-04-02 LAB — IRON AND TIBC
Iron: 77 ug/dL (ref 42–163)
Saturation Ratios: 24 % (ref 20–55)
TIBC: 328 ug/dL (ref 202–409)
UIBC: 250 ug/dL (ref 117–376)

## 2019-04-02 NOTE — Progress Notes (Signed)
Hematology and Oncology Follow Up Visit  LIEV BROCKBANK [id number] 1950/03/19 69 y.o. 04/02/2019 1:15 PM [name], MDHusain, Denton Ar, MD   Principle Diagnosis: 69 year old with iron deficiency anemia related to chronic blood losses and liver disease.  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    Current therapy: Active surveillance with intravenous iron replacement as needed.  Interim History: Mr. Brame is here for return evaluation.  Since the last visit, he developed an episode of SVT and was seen in the emergency department and required adenosine few weeks ago.  This has resolved at this time and is followed with Dr. Tamala Julian regarding that.  He denies any epistaxis, hematochezia or melena.  He is currently on diuretics for his ascites which have helped him lose significant amount of water weight.  He feels reasonably well at this time.    Medications: Reviewed today. Current Outpatient Medications  Medication Sig Dispense Refill  . alfuzosin (UROXATRAL) 10 MG 24 hr tablet Take 10 mg by mouth daily with breakfast.    . atorvastatin (LIPITOR) 20 MG tablet Take 20 mg by mouth daily.    Marland Kitchen azaTHIOprine (IMURAN) 50 MG tablet Take 100 mg by mouth daily with breakfast.     . Calcium Carb-Cholecalciferol (CALCIUM 1000 + D) 1000-800 MG-UNIT TABS Take 1 tablet by mouth daily.    . fish oil-omega-3 fatty acids 1000 MG capsule Take 2 g by mouth daily.    . furosemide (LASIX) 40 MG tablet Take 40 mg by mouth daily.    . nadolol (CORGARD) 40 MG tablet Take 40 mg by mouth daily with breakfast.     . RABEprazole (ACIPHEX) 20 MG tablet Take 20 mg by mouth daily.    Marland Kitchen spironolactone (ALDACTONE) 50 MG tablet Take 50 mg by mouth daily.    . tadalafil (CIALIS) 5 MG tablet Take 5 mg by mouth daily.      No current facility-administered medications for this visit.     Allergies:  Allergies  Allergen Reactions  . Latex Other (See Comments)    Burns skin off  . Shellfish  Allergy Anaphylaxis, Swelling and Other (See Comments)    Legs swell and throat puffed out  . Gluten Other (See Comments)    Upset stomach   . Sulfa Antibiotics Other (See Comments)    Pass out  . Other Rash    PINEAPPLE       Physical Exam: Blood pressure 107/64, pulse (!) 58, temperature 99.1 F (37.3 C), temperature source Temporal, resp. rate 16, height 6' 4"  (1.93 m), weight 237 lb 8 oz (107.7 kg), SpO2 99 %.   ECOG: 1   General appearance: Comfortable appearing without any discomfort Head: Normocephalic without any trauma Oropharynx: Mucous membranes are moist and pink without any thrush or ulcers. Eyes: Pupils are equal and round reactive to light. Lymph nodes: No cervical, supraclavicular, inguinal or axillary lymphadenopathy.   Heart:regular rate and rhythm.  S1 and S2 without leg edema. Lung: Clear without any rhonchi or wheezes.  No dullness to percussion. Abdomin: Soft, nontender, nondistended with good bowel sounds.  No hepatosplenomegaly. Musculoskeletal: No joint deformity or effusion.  Full range of motion noted. Neurological: No deficits noted on motor, sensory and deep tendon reflex exam. Skin: No petechial rash or dryness.  Appeared moist.     Lab Results: Lab Results  Component Value Date   WBC 3.5 (L) 04/02/2019   HGB 11.6 (L) 04/02/2019   HCT 34.8 (L) 04/02/2019  MCV 100.6 (H) 04/02/2019   PLT 105 (L) 04/02/2019     Chemistry      Component Value Date/Time   NA 139 09/11/2017 1427   K 4.2 09/11/2017 1427   CL 106 09/11/2017 1427   CO2 24 09/11/2017 1427   BUN 8 09/11/2017 1427   CREATININE 0.82 09/11/2017 1427   CREATININE 0.93 03/11/2015 1521      Component Value Date/Time   CALCIUM 7.8 (L) 09/11/2017 1427   ALKPHOS 62 01/28/2014 0931   AST 45 (H) 01/28/2014 0931   ALT 20 01/28/2014 0931   BILITOT 1.0 01/28/2014 0931       Impression and Plan:  69 year old man with    1.  Iron deficiency anemia diagnosed in 2017.  He has  chronic blood loss related this to cirrhosis of the liver.  Iron studies obtained in July 2020 showed normal level of iron and ferritin.  His hemoglobin is adequate today and iron studies are currently pending.  Risks and benefits of repeat intravenous iron was discussed.  Complications including arthralgias, myalgias and infusion related complications were reviewed.  Will await the results of his iron studies and determine the next course of action.    2. Thrombocytopenia: Related to cirrhosis of the liver and the portal hypertension causing splenic sequestration.  His platelet count is above 100,000 without any active bleeding.  No intervention is needed at this time.   3.  Cirrhosis of the liver: Currently compensated without any recent issues.  He has follow-up at [facility] currently on the transplant list.  4.  SVT: He is currently in normal sinus rhythm.  He has follow-up with cardiology.   5. Follow-up: He will return in 6 months for a follow-up.  30 minutes was spent on this encounter.  The time was dedicated to reviewing laboratory data, disease status update, treatment options and complications related to therapy.  Zola Button, MD 1/27/20211:15 PM

## 2019-04-03 ENCOUNTER — Telehealth: Payer: Self-pay | Admitting: Oncology

## 2019-04-03 NOTE — Telephone Encounter (Signed)
No los per 1/27.

## 2019-05-21 ENCOUNTER — Other Ambulatory Visit: Payer: Self-pay | Admitting: *Deleted

## 2019-05-21 DIAGNOSIS — I2510 Atherosclerotic heart disease of native coronary artery without angina pectoris: Secondary | ICD-10-CM

## 2019-05-21 DIAGNOSIS — I77810 Thoracic aortic ectasia: Secondary | ICD-10-CM

## 2019-06-28 ENCOUNTER — Emergency Department (HOSPITAL_COMMUNITY): Payer: Medicare Other

## 2019-06-28 ENCOUNTER — Emergency Department (HOSPITAL_COMMUNITY)
Admission: EM | Admit: 2019-06-28 | Discharge: 2019-06-28 | Disposition: A | Discharge status: Home / Self Care | Payer: Medicare Other | Attending: Emergency Medicine | Admitting: Emergency Medicine

## 2019-06-28 ENCOUNTER — Other Ambulatory Visit: Payer: Self-pay

## 2019-06-28 DIAGNOSIS — N5089 Other specified disorders of the male genital organs: Secondary | ICD-10-CM

## 2019-06-28 DIAGNOSIS — Y929 Unspecified place or not applicable: Secondary | ICD-10-CM | POA: Diagnosis not present

## 2019-06-28 DIAGNOSIS — Z79899 Other long term (current) drug therapy: Secondary | ICD-10-CM | POA: Diagnosis not present

## 2019-06-28 DIAGNOSIS — Y93E6 Activity, residential relocation: Secondary | ICD-10-CM | POA: Insufficient documentation

## 2019-06-28 DIAGNOSIS — X500XXA Overexertion from strenuous movement or load, initial encounter: Secondary | ICD-10-CM | POA: Insufficient documentation

## 2019-06-28 DIAGNOSIS — K754 Autoimmune hepatitis: Secondary | ICD-10-CM | POA: Diagnosis not present

## 2019-06-28 DIAGNOSIS — R1032 Left lower quadrant pain: Secondary | ICD-10-CM | POA: Diagnosis present

## 2019-06-28 DIAGNOSIS — N50812 Left testicular pain: Secondary | ICD-10-CM | POA: Diagnosis not present

## 2019-06-28 DIAGNOSIS — K900 Celiac disease: Secondary | ICD-10-CM | POA: Diagnosis not present

## 2019-06-28 DIAGNOSIS — K4090 Unilateral inguinal hernia, without obstruction or gangrene, not specified as recurrent: Secondary | ICD-10-CM | POA: Diagnosis not present

## 2019-06-28 DIAGNOSIS — Y999 Unspecified external cause status: Secondary | ICD-10-CM | POA: Diagnosis not present

## 2019-06-28 LAB — CBC WITH DIFFERENTIAL/PLATELET
Abs Immature Granulocytes: 0 10*3/uL (ref 0.00–0.07)
Basophils Absolute: 0 10*3/uL (ref 0.0–0.1)
Basophils Relative: 1 %
Eosinophils Absolute: 0 10*3/uL (ref 0.0–0.5)
Eosinophils Relative: 3 %
HCT: 33.1 % — ABNORMAL LOW (ref 39.0–52.0)
Hemoglobin: 10.7 g/dL — ABNORMAL LOW (ref 13.0–17.0)
Immature Granulocytes: 0 %
Lymphocytes Relative: 23 %
Lymphs Abs: 0.3 10*3/uL — ABNORMAL LOW (ref 0.7–4.0)
MCH: 34.6 pg — ABNORMAL HIGH (ref 26.0–34.0)
MCHC: 32.3 g/dL (ref 30.0–36.0)
MCV: 107.1 fL — ABNORMAL HIGH (ref 80.0–100.0)
Monocytes Absolute: 0.1 10*3/uL (ref 0.1–1.0)
Monocytes Relative: 10 %
Neutro Abs: 0.7 10*3/uL — ABNORMAL LOW (ref 1.7–7.7)
Neutrophils Relative %: 63 %
Platelets: 49 10*3/uL — ABNORMAL LOW (ref 150–400)
RBC: 3.09 MIL/uL — ABNORMAL LOW (ref 4.22–5.81)
RDW: 17.6 % — ABNORMAL HIGH (ref 11.5–15.5)
WBC: 1.1 10*3/uL — CL (ref 4.0–10.5)
nRBC: 0 % (ref 0.0–0.2)

## 2019-06-28 LAB — COMPREHENSIVE METABOLIC PANEL
ALT: 29 U/L (ref 0–44)
AST: 57 U/L — ABNORMAL HIGH (ref 15–41)
Albumin: 3.2 g/dL — ABNORMAL LOW (ref 3.5–5.0)
Alkaline Phosphatase: 68 U/L (ref 38–126)
Anion gap: 8 (ref 5–15)
BUN: 11 mg/dL (ref 8–23)
CO2: 26 mmol/L (ref 22–32)
Calcium: 8.5 mg/dL — ABNORMAL LOW (ref 8.9–10.3)
Chloride: 104 mmol/L (ref 98–111)
Creatinine, Ser: 0.86 mg/dL (ref 0.61–1.24)
GFR calc Af Amer: >60 mL/min (ref >60)
GFR calc non Af Amer: >60 mL/min (ref >60)
Glucose, Bld: 106 mg/dL — ABNORMAL HIGH (ref 70–99)
Potassium: 4 mmol/L (ref 3.5–5.1)
Sodium: 138 mmol/L (ref 135–145)
Total Bilirubin: 2 mg/dL — ABNORMAL HIGH (ref 0.3–1.2)
Total Protein: 7.8 g/dL (ref 6.5–8.1)

## 2019-06-28 LAB — URINALYSIS, ROUTINE W REFLEX MICROSCOPIC
Bilirubin Urine: NEGATIVE
Glucose, UA: NEGATIVE mg/dL
Hgb urine dipstick: NEGATIVE
Ketones, ur: NEGATIVE mg/dL
Leukocytes,Ua: NEGATIVE
Nitrite: NEGATIVE
Protein, ur: NEGATIVE mg/dL
Specific Gravity, Urine: 1.021 (ref 1.005–1.030)
pH: 6 (ref 5.0–8.0)

## 2019-06-28 MED ORDER — IOHEXOL 300 MG/ML  SOLN
100.0000 mL | Freq: Once | INTRAMUSCULAR | Status: AC | PRN
  Administered 2019-06-28: 100 mL via INTRAVENOUS

## 2019-06-28 MED ORDER — SODIUM CHLORIDE (PF) 0.9 % IJ SOLN
INTRAMUSCULAR | Status: AC
  Filled 2019-06-28: qty 50

## 2019-06-28 NOTE — ED Notes (Signed)
Date and time results received: 06/28/19 11:26 AM (use smartphrase ".now" to insert current time)  Test: WBC Critical Value: 1.1  Name of Provider Notified: Ella Bodo  Orders Received? Or Actions Taken?: notify providers

## 2019-06-28 NOTE — ED Provider Notes (Signed)
[facility] DEPT Provider Note   CSN: [id number] Arrival date & time: 06/28/19  0457     History Chief Complaint  Patient presents with  . Groin Swelling    Corey Shannon is a 69 y.o. male with PMHx HLD, IDA, Autoimmune hepatitis who presents to the ED today with complaint of gradual onset, constant, worsening, left inguinal/testicular pain x 3-4 days. Pt reports doing a lot of heavy lifting as of late due to being in the process of moving. He noticed the pain a couple of days ago and reports it has gotten worse. He has not taken anything for pain; reports due to his hepatitis and currently on the liver transplant list he has been told to stay away from Tylenol and NSAIDs if can be avoided. Pt denies any fevers, chills, nausea, vomiting, urinary sx, penile discharge, or any other associated symptoms.   The history is provided by the patient and medical records.       Past Medical History:  Diagnosis Date  . Arthritis   . Autoimmune hepatitis ([place]) dx 2005   secondary to gluten allergy  . GERD (gastroesophageal reflux disease)   . Gluten intolerance   . History of nuclear stress test    Myoview 1/17: EF 61%, inferior and inferolateral ischemia, low risk  . Hyperlipidemia   . Pneumonia     Patient Active Problem List   Diagnosis Date Noted  . DOE (dyspnea on exertion) 11/20/2017  . Chest pain at rest 09/13/2017  . Coronary artery calcification seen on CAT scan 09/13/2017  . Left ventricular hypertrophy 08/16/2017  . Dilated aortic root ([place]) 08/16/2017  . Iron deficiency anemia 03/20/2017  . Celiac sprue 09/29/2013  . Autoimmune hepatitis ([place]) 09/29/2013  . HYPERCHOLESTEROLEMIA 05/03/2006  . IRRITABLE BOWEL SYNDROME 05/03/2006    Past Surgical History:  Procedure Laterality Date  . BACK SURGERY    . CHOLECYSTECTOMY    . ESOPHAGEAL BANDING N/A 11/29/2012   Procedure: ESOPHAGEAL BANDING;  Surgeon: Beryle Beams, MD;  Location: [facility]  ENDOSCOPY;  Service: Endoscopy;  Laterality: N/A;  . ESOPHAGEAL BANDING N/A 01/24/2013   Procedure: ESOPHAGEAL BANDING;  Surgeon: Beryle Beams, MD;  Location: [facility] ENDOS[facility];  Service: Endoscopy;  Laterality: N/A;  . ESOPHAGEAL BANDING N/A 02/21/2013   Procedure: ESOPHAGEAL BANDING;  Surgeon: Beryle Beams, MD;  Location: [facility]OS[facility];  Service: Endoscopy;  Laterality: N/A;  . ESOPHAGEAL BANDING N/A 03/20/2014   Procedure: ESOPHAGEAL BANDING;  Surgeon: Beryle Beams, MD;  Location: [facility]OS[facility];  Service: Endoscopy;  Laterality: N/A;  . ESOPHAGOGASTRODUODENOSCOPY N/A 11/29/2012   Procedure: ESOPHAGOGASTRODUODENOSCOPY (EGD);  Surgeon: Beryle Beams, MD;  Location: [facility];  Service: Endoscopy;  Laterality: N/A;  . ESOPHAGOGASTRODUODENOSCOPY N/A 01/24/2013   Procedure: ESOPHAGOGASTRODUODENOSCOPY (EGD);  Surgeon: Beryle Beams, MD;  Location: [facility];  Service: Endoscopy;  Laterality: N/A;  . ESOPHAGOGASTRODUODENOSCOPY N/A 02/21/2013   Procedure: ESOPHAGOGASTRODUODENOSCOPY (EGD);  Surgeon: Beryle Beams, MD;  Location: [facility];  Service: Endoscopy;  Laterality: N/A;  . ESOPHAGOGASTRODUODENOSCOPY (EGD) WITH PROPOFOL N/A 03/20/2014   Procedure: ESOPHAGOGASTRODUODENOSCOPY (EGD) WITH PROPOFOL;  Surgeon: Beryle Beams, MD;  Location: [facility];  Service: Endoscopy;  Laterality: N/A;  . LEFT HEART CATH AND CORONARY ANGIOGRAPHY N/A 09/13/2017   Procedure: LEFT HEART CATH AND CORONARY ANGIOGRAPHY;  Surgeon: Belva Crome, MD;  Location: [facility];  Service: Cardiovascular;  Laterality: N/A;  . SPINE SURGERY     x 4, 9 disc fusion  Family History  Problem Relation Age of Onset  . Osteoporosis Mother   . Heart attack Father   . Heart disease Father   . Alzheimer's disease Father   . Stroke Father     Social History   Tobacco Use  . Smoking status: Never Smoker  . Smokeless tobacco: Never Used  Substance Use Topics  . Alcohol use: Yes    Comment: wine  weekly-3 glasses  . Drug use: No    Home Medications Prior to Admission medications   Medication Sig Start Date End Date Taking? Authorizing Provider  alfuzosin (UROXATRAL) 10 MG 24 hr tablet Take 10 mg by mouth every evening.    Yes [provider]  atorvastatin (LIPITOR) 20 MG tablet Take 20 mg by mouth daily.   Yes [provider]  azaTHIOprine (IMURAN) 50 MG tablet Take 100 mg by mouth every evening.    Yes [provider]  Calcium Carb-Cholecalciferol (CALCIUM 1000 + D) 1000-800 MG-UNIT TABS Take 1 tablet by mouth daily. 03/30/15  Yes Weaver, Scott T, PA-C  ciprofloxacin (CIPRO) 500 MG tablet Take 500 mg by mouth 2 (two) times daily. 06/26/19  Yes [provider]  furosemide (LASIX) 40 MG tablet Take 40 mg by mouth every evening.   Yes [provider]  nadolol (CORGARD) 40 MG tablet Take 40 mg by mouth every evening.    Yes [provider]  RABEprazole (ACIPHEX) 20 MG tablet Take 20 mg by mouth daily. 07/12/18  Yes [provider]  spironolactone (ALDACTONE) 50 MG tablet Take 50 mg by mouth every evening.   Yes [provider]  tadalafil (CIALIS) 5 MG tablet Take 5 mg by mouth every evening.    Yes [provider]    Allergies    Latex, Shellfish allergy, Gluten, Sulfa antibiotics, and Other  Review of Systems   Review of Systems  Constitutional: Negative for chills and fever.  Gastrointestinal: Positive for abdominal pain. Negative for constipation, diarrhea, nausea and vomiting.  Genitourinary: Positive for testicular pain. Negative for difficulty urinating and dysuria.  All other systems reviewed and are negative.   Physical Exam Updated Vital Signs BP 109/67 (BP Location: Left Arm)   Pulse (!) 59   Temp 98.2 F (36.8 C) (Oral)   Resp 18   Ht 6' 2.5" (1.892 m)   Wt 106.1 kg   SpO2 98%   BMI 29.64 kg/m   Physical Exam Vitals and nursing note reviewed.  Constitutional:      Appearance: He  is not ill-appearing or diaphoretic.  HENT:     Head: Normocephalic and atraumatic.  Eyes:     Conjunctiva/sclera: Conjunctivae normal.  Cardiovascular:     Rate and Rhythm: Normal rate and regular rhythm.     Pulses: Normal pulses.  Pulmonary:     Effort: Pulmonary effort is normal.     Breath sounds: Normal breath sounds. No wheezing, rhonchi or rales.  Abdominal:     Palpations: Abdomen is soft.     Tenderness: There is abdominal tenderness. There is no right CVA tenderness, left CVA tenderness, guarding or rebound.     Hernia: A hernia is present.     Comments: Soft, + left inguinal hernia appreciated with TTP; no overlying skin changes to hernia including erythema or increased warmth, +BS throughout, no r/g/r, neg murphy's, neg mcburney's, no CVA TTP  Genitourinary:    Comments: Chaperone present for exam Circumcised penis without phimosis/paraphimosis, hypospadias, erythema, tenderness, or discharge. No rashes or  lesions. Left testicle with mild TTP;  cremasterics reflex present bilaterally. No abnormal lie. No inguinal hernias or adenopathy present. Musculoskeletal:     Cervical back: Neck supple.  Skin:    General: Skin is warm and dry.  Neurological:     Mental Status: He is alert.     ED Results / Procedures / Treatments   Labs (all labs ordered are listed, but only abnormal results are displayed) Labs Reviewed  COMPREHENSIVE METABOLIC PANEL - Abnormal; Notable for the following components:      Result Value   Glucose, Bld 106 (*)    Calcium 8.5 (*)    Albumin 3.2 (*)    AST 57 (*)    Total Bilirubin 2.0 (*)    All other components within normal limits  CBC WITH DIFFERENTIAL/PLATELET - Abnormal; Notable for the following components:   WBC 1.1 (*)    RBC 3.09 (*)    Hemoglobin 10.7 (*)    HCT 33.1 (*)    MCV 107.1 (*)    MCH 34.6 (*)    RDW 17.6 (*)    Platelets 49 (*)    Neutro Abs 0.7 (*)    Lymphs Abs 0.3 (*)    All other components within normal limits    URINALYSIS, ROUTINE W REFLEX MICROSCOPIC - Abnormal; Notable for the following components:   Color, Urine AMBER (*)    All other components within normal limits  PATHOLOGIST SMEAR REVIEW    EKG None  Radiology CT Abdomen Pelvis W Contrast  Result Date: 06/28/2019 CLINICAL DATA:  Scrotal swelling. EXAM: CT ABDOMEN AND PELVIS WITH CONTRAST TECHNIQUE: Multidetector CT imaging of the abdomen and pelvis was performed using the standard protocol following bolus administration of intravenous contrast. CONTRAST:  182m OMNIPAQUE IOHEXOL 300 MG/ML  SOLN COMPARISON:  CT scan 05/31/2018 FINDINGS: Lower chest: The lung bases are clear of an acute process. No pleural effusions. No pericardial effusion. Hepatobiliary: Stable severe cirrhotic changes with portal venous hypertension, portal venous collaterals, massive splenomegaly and moderate volume abdominal/pelvic ascites. No obvious hepatic lesions. The gallbladder is surgically absent. No intra or extrahepatic biliary dilatation. Pancreas: No mass, inflammation or ductal dilatation. Spleen: Massive splenomegaly and extensive perisplenic collateral vasculature. Adrenals/Urinary Tract: The adrenal glands and kidneys are unremarkable. The bladder is normal. Stomach/Bowel: The stomach, duodenum, small bowel and colon are grossly normal without oral contrast. Vascular/Lymphatic: Stable scattered atherosclerotic calcifications involving the aorta and branch vessels. No aneurysm or dissection. No mesenteric or retroperitoneal mass or adenopathy. Scattered upper abdominal lymph nodes, typical with cirrhosis. Reproductive: The prostate gland and seminal vesicles are unremarkable. TURP defect noted. Other: Moderate volume abdominal/pelvic ascites. Musculoskeletal: Stable thoracolumbar fusion changes. No acute bony findings. IMPRESSION: 1. Stable severe cirrhotic changes with portal venous hypertension, portal venous collaterals, massive splenomegaly and moderate volume  abdominal/pelvic ascites. 2. No obvious hepatic lesions. 3. No acute abdominal/pelvic findings, mass lesions or lymphadenopathy. Aortic Atherosclerosis (ICD10-I70.0). Electronically Signed   By: PMarijo SanesM.D.   On: 06/28/2019 12:17   [place] W/DOPPLER  Result Date: 06/28/2019 CLINICAL DATA:  Initial evaluation for left groin swelling for 3 days. EXAM: SCROTAL ULTRASOUND DOPPLER ULTRASOUND OF THE TESTICLES TECHNIQUE: Complete ultrasound examination of the testicles, epididymis, and other scrotal structures was performed. Color and spectral Doppler ultrasound were also utilized to evaluate blood flow to the testicles. COMPARISON:  None available. FINDINGS: Right testicle Measurements: 5.0 x 2.5 x 3.5 cm. Right testis demonstrates a heterogeneous echotexture without discrete mass. No microlithiasis. Left testicle  Measurements: 5.5 x 2.5 x 3.0 cm. Left testis demonstrates a heterogeneous echotexture without discrete mass. No microlithiasis. Right epididymis:  Normal in size and appearance. Left epididymis:  Normal in size and appearance. Hydrocele:  Small left-sided hydrocele. Varicocele:  Small right-sided varicocele. Pulsed Doppler interrogation of both testes demonstrates normal low resistance arterial and venous waveforms bilaterally. Target ultrasound of left groin demonstrates a 2.8 x 1.3 x 3.6 cm fluid-filled structure, likely a small left inguinal hernia containing fluid. A fluid-filled herniated loops of bowel could also have this appearance. Right groin within normal limits. IMPRESSION: 1. 2.8 x 1.3 x 3.6 cm fluid-filled structure within the left inguinal canal, likely a small left inguinal hernia containing fluid. Possible herniated fluid-filled loop of bowel could also be present. Further assessment dedicated cross-sectional imaging suggested for further evaluation. 2. No other acute abnormality identified. 3. Small left-sided hydrocele. 4. Small right-sided varicocele. 5. Heterogeneous  echotexture of the testes without discrete mass. No evidence for torsion. Electronically Signed   By: Jeannine Boga M.D.   On: 06/28/2019 07:40    Procedures Procedures (including critical care time)  Medications Ordered in ED Medications  sodium chloride (PF) 0.9 % injection (has no administration in time range)  iohexol (OMNIPAQUE) 300 MG/ML solution 100 mL (100 mLs Intravenous Contrast Given 06/28/19 1132)    ED Course  I have reviewed the triage vital signs and the nursing notes.  Pertinent labs & imaging results that were available during my care of the patient were reviewed by me and considered in my medical decision making (see chart for details).    MDM Rules/Calculators/A&P                      69 year old male presenting to the ED today with complaint of left sided inguinal pain/testicular pain x 3-4 days after heavy lifting. On arrival to the ED pt is afebrile, nontachycardic, and nontachypneic. A scrotal ultrasound was performed while pt was in the waiting room; does show concern for possible fluid along the inguinal canal worrisome for a hernia; recommend further imaging with CT scan. On exam after pt brought back to room he does have a small inguinal hernia on left side however no color change or increased warmth. Findings not consistent with incarcerated vs strangulated bowel. Will obtain screening labs and CT scan and reevaluate.   CBC with leukopenia 1.1; this does appear to be around pt's baseline. Per Care Everywhere pt had labs done on 4/12 with WBC 1.8. Hgb stable.  CMP with bili 2.2; most recent 04/12 with bili 1.9. No other electrolyte abnormalities.  U/A without infection.   CT scan without obvious findings besides pt baseline liver abnormalities. On reassessment it appears hernia has reduced on its own. Will discharge patient home at this time with close outpatient follow up with general surgery. Pt advised to avoid increased intraabdominal pressures to  prevent hernia from coming out again. Strict return precautions have been discussed with pt. He is in agreement with plan and stable for discharge home.   This note was prepared using Dragon voice recognition software and may include unintentional dictation errors due to the inherent limitations of voice recognition software.  Final Clinical Impression(s) / ED Diagnoses Final diagnoses:  Unilateral inguinal hernia without obstruction or gangrene, recurrence not specified    Rx / DC Orders ED Discharge Orders    None       Discharge Instructions     Please follow up with  [facility] regarding your hernia. Attached are additional resources regarding hernias. Please avoid any heavy lifting > 20 pounds and avoid straining as this can put pressure on your abdominal wall.   Return to the ED IMMEDIATELY for any worsening symptoms including hernia that is unable to be manually reduced, skin changes to the hernia site including redness/increased warmth/color change including black coloring, fevers > 100.4, nausea, vomiting       Eustaquio Maize, PA-C 06/28/19 1252    Davonna Belling, MD 06/28/19 1351

## 2019-06-28 NOTE — ED Triage Notes (Signed)
Pt c/o swelling of testis after moving all week. Swelling got worse the last 36 hrs.

## 2019-06-28 NOTE — ED Notes (Signed)
Patient transported to CT 

## 2019-06-28 NOTE — Discharge Instructions (Signed)
Please follow up with [facility] regarding your hernia. Attached are additional resources regarding hernias. Please avoid any heavy lifting > 20 pounds and avoid straining as this can put pressure on your abdominal wall.   Return to the ED IMMEDIATELY for any worsening symptoms including hernia that is unable to be manually reduced, skin changes to the hernia site including redness/increased warmth/color change including black coloring, fevers > 100.4, nausea, vomiting

## 2019-06-30 LAB — PATHOLOGIST SMEAR REVIEW

## 2019-08-19 ENCOUNTER — Ambulatory Visit (HOSPITAL_COMMUNITY): Payer: Medicare Other | Attending: Cardiovascular Disease

## 2019-08-19 ENCOUNTER — Other Ambulatory Visit: Payer: Self-pay

## 2019-08-19 DIAGNOSIS — I2510 Atherosclerotic heart disease of native coronary artery without angina pectoris: Secondary | ICD-10-CM | POA: Diagnosis present

## 2019-08-19 DIAGNOSIS — I77810 Thoracic aortic ectasia: Secondary | ICD-10-CM

## 2020-02-06 ENCOUNTER — Telehealth: Payer: Self-pay | Admitting: Interventional Cardiology

## 2020-02-06 DIAGNOSIS — I77810 Thoracic aortic ectasia: Secondary | ICD-10-CM

## 2020-02-06 NOTE — Telephone Encounter (Signed)
Do you want any testing prior to his appt in March?   Last echo 08/19/19 Last Chest CT Aorta 04/17/16

## 2020-02-06 NOTE — Telephone Encounter (Signed)
Patient wants to know if he needs to have anything done prior to his appt in March with Dr. Tamala Julian. States he normally has an ultrasound and CT done to monitor his aortic aneurysm. Please advise.

## 2020-02-13 NOTE — Telephone Encounter (Signed)
Spoke with wife, DPR on file.  Made her aware that we are placing order for CT aorta and the scheduler would be in contact.  Wife verbalized understanding and was appreciative for call.

## 2020-02-13 NOTE — Telephone Encounter (Signed)
He will need an aortic CT angiogram to properly sized ascending aorta.  No echo this year.

## 2020-03-22 ENCOUNTER — Ambulatory Visit (HOSPITAL_COMMUNITY): Payer: Medicare Other

## 2020-03-26 ENCOUNTER — Inpatient Hospital Stay: Admission: RE | Admit: 2020-03-26 | Payer: Medicare Other | Source: Ambulatory Visit

## 2020-04-06 ENCOUNTER — Other Ambulatory Visit: Payer: Self-pay

## 2020-04-06 ENCOUNTER — Ambulatory Visit (HOSPITAL_COMMUNITY)
Admission: RE | Admit: 2020-04-06 | Discharge: 2020-04-06 | Disposition: A | Discharge status: Home / Self Care | Payer: Medicare Other | Source: Ambulatory Visit | Attending: Interventional Cardiology | Admitting: Interventional Cardiology

## 2020-04-06 ENCOUNTER — Encounter (HOSPITAL_COMMUNITY): Payer: Self-pay

## 2020-04-06 DIAGNOSIS — I77810 Thoracic aortic ectasia: Secondary | ICD-10-CM | POA: Diagnosis present

## 2020-04-06 LAB — POCT I-STAT CREATININE: Creatinine, Ser: 1.4 mg/dL — ABNORMAL HIGH (ref 0.61–1.24)

## 2020-04-06 MED ORDER — IOHEXOL 350 MG/ML SOLN
100.0000 mL | Freq: Once | INTRAVENOUS | Status: AC | PRN
  Administered 2020-04-06: 75 mL via INTRAVENOUS

## 2020-05-10 NOTE — Progress Notes (Signed)
Cardiology Office Note:    Date:  05/11/2020   ID:  Corey Shannon, DOB 12/29/1950, MRN [id number]  PCP:  Patient, No Pcp Per  Cardiologist:  Sinclair Grooms, MD   Referring MD: No ref. provider found   Chief Complaint  Patient presents with  . Coronary Artery Disease  . Congestive Heart Failure    History of Present Illness:    Corey Shannon is a 69 y.o. male with a hx of  CP,dyspnea on exertion,syncope, and hyperlipidemia.Coincidentally identified aortic root enlargement (4.2 cm), coronary calcification, and severe left ventricular hypertrophy.Recent cath with widely patent coronary arteries July 2019was performed because of exertional dyspnea and atypical chest pain.  No chest pain.  Denies orthopnea, PND, dyspnea.  No peripheral edema.  Denies palpitations.  No blood in the urine or stool.  Past Medical History:  Diagnosis Date  . Arthritis   . Autoimmune hepatitis ([place]) dx 2005   secondary to gluten allergy  . GERD (gastroesophageal reflux disease)   . Gluten intolerance   . History of nuclear stress test    Myoview 1/17: EF 61%, inferior and inferolateral ischemia, low risk  . Hyperlipidemia   . Pneumonia     Past Surgical History:  Procedure Laterality Date  . BACK SURGERY    . CHOLECYSTECTOMY    . ESOPHAGEAL BANDING N/A 11/29/2012   Procedure: ESOPHAGEAL BANDING;  Surgeon: Beryle Beams, MD;  Location: [facility];  Service: Endoscopy;  Laterality: N/A;  . ESOPHAGEAL BANDING N/A 01/24/2013   Procedure: ESOPHAGEAL BANDING;  Surgeon: Beryle Beams, MD;  Location: [facility];  Service: Endoscopy;  Laterality: N/A;  . ESOPHAGEAL BANDING N/A 02/21/2013   Procedure: ESOPHAGEAL BANDING;  Surgeon: Beryle Beams, MD;  Location: [facility];  Service: Endoscopy;  Laterality: N/A;  . ESOPHAGEAL BANDING N/A 03/20/2014   Procedure: ESOPHAGEAL BANDING;  Surgeon: Beryle Beams, MD;  Location: [facility];  Service: Endoscopy;  Laterality: N/A;  .  ESOPHAGOGASTRODUODENOSCOPY N/A 11/29/2012   Procedure: ESOPHAGOGASTRODUODENOSCOPY (EGD);  Surgeon: Beryle Beams, MD;  Location: [facility];  Service: Endoscopy;  Laterality: N/A;  . ESOPHAGOGASTRODUODENOSCOPY N/A 01/24/2013   Procedure: ESOPHAGOGASTRODUODENOSCOPY (EGD);  Surgeon: Beryle Beams, MD;  Location: [facility];  Service: Endoscopy;  Laterality: N/A;  . ESOPHAGOGASTRODUODENOSCOPY N/A 02/21/2013   Procedure: ESOPHAGOGASTRODUODENOSCOPY (EGD);  Surgeon: Beryle Beams, MD;  Location: [facility];  Service: Endoscopy;  Laterality: N/A;  . ESOPHAGOGASTRODUODENOSCOPY (EGD) WITH PROPOFOL N/A 03/20/2014   Procedure: ESOPHAGOGASTRODUODENOSCOPY (EGD) WITH PROPOFOL;  Surgeon: Beryle Beams, MD;  Location: [facility];  Service: Endoscopy;  Laterality: N/A;  . LEFT HEART CATH AND CORONARY ANGIOGRAPHY N/A 09/13/2017   Procedure: LEFT HEART CATH AND CORONARY ANGIOGRAPHY;  Surgeon: Belva Crome, MD;  Location: [facility];  Service: Cardiovascular;  Laterality: N/A;  . SPINE SURGERY     x 4, 9 disc fusion    Current Medications: Current Meds  Medication Sig  . alfuzosin (UROXATRAL) 10 MG 24 hr tablet Take 10 mg by mouth every evening.   Marland Kitchen atorvastatin (LIPITOR) 20 MG tablet Take 20 mg by mouth daily.  . Calcium Carb-Cholecalciferol (CALCIUM 1000 + D) 1000-800 MG-UNIT TABS Take 1 tablet by mouth daily.  . ciprofloxacin (CIPRO) 500 MG tablet Take 500 mg by mouth 2 (two) times daily.  . furosemide (LASIX) 40 MG tablet Take 40 mg by mouth every evening.  . nadolol (CORGARD) 40 MG tablet Take 40 mg by mouth every evening.   Marland Kitchen  RABEprazole (ACIPHEX) 20 MG tablet Take 20 mg by mouth daily.  Marland Kitchen spironolactone (ALDACTONE) 50 MG tablet Take 50 mg by mouth every evening.  . tacrolimus (PROGRAF) 0.5 MG capsule Take 0.5 mg by mouth 2 (two) times daily.  . tadalafil (CIALIS) 5 MG tablet Take 5 mg by mouth every evening.     Allergies:   Latex, Shellfish allergy, Gluten, Sulfa antibiotics, and  Other   Social History   Socioeconomic History  . Marital status: Married    Spouse name: Not on file  . Number of children: Not on file  . Years of education: Not on file  . Highest education level: Not on file  Occupational History  . Not on file  Tobacco Use  . Smoking status: Never Smoker  . Smokeless tobacco: Never Used  Vaping Use  . Vaping Use: Never used  Substance and Sexual Activity  . Alcohol use: Yes    Comment: wine weekly-3 glasses  . Drug use: No  . Sexual activity: Not on file  Other Topics Concern  . Not on file  Social History Narrative  . Not on file   Social Determinants of Health   Financial Resource Strain: Not on file  Food Insecurity: Not on file  Transportation Needs: Not on file  Physical Activity: Not on file  Stress: Not on file  Social Connections: Not on file     Family History: The patient's family history includes Alzheimer's disease in his father; Heart attack in his father; Heart disease in his father; Osteoporosis in his mother; Stroke in his father.  ROS:   Please see the history of present illness.    Losing weight all other systems reviewed and are negative.  EKGs/Labs/Other Studies Reviewed:    The following studies were reviewed today:  2D Doppler echocardiogram June 2021: IMPRESSIONS    1. Left ventricular ejection fraction, by estimation, is 60 to 65%. The  left ventricle has normal function. The left ventricle has no regional  wall motion abnormalities. There is mild concentric left ventricular  hypertrophy. Left ventricular diastolic  parameters were normal.  2. Right ventricular systolic function is normal. The right ventricular  size is normal. There is normal pulmonary artery systolic pressure. The  estimated right ventricular systolic pressure is 16.3 mmHg.  3. The mitral valve is grossly normal. No evidence of mitral valve  regurgitation. No evidence of mitral stenosis.  4. The aortic valve is  tricuspid. Aortic valve regurgitation is not  visualized. No aortic stenosis is present.  5. Aortic dilatation noted. There is moderate dilatation of the aortic  root and of the ascending aorta measuring 43 mm.  6. The inferior vena cava is normal in size with greater than 50%  respiratory variability, suggesting right atrial pressure of 3 mmHg.   Comparison(s): No significant change from prior study. Aortic root  unchanged. 43 mm on current study and 44 mm 08/13/2018.    CT angio chest April 07, 2020: IMPRESSION: 1. 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  Stable size compared to prior from 2017 and subsequent. 2. Cardiomegaly. 3. Coronary artery calcifications. Aortic Atherosclerosis (ICD10-I70.0). 4. Stigmata of cirrhosis and portal venous hypertension including splenomegaly and small volume intra-abdominal ascites.  EKG:  EKG sinus bradycardia nonspecific ST abnormality and otherwise/unchanged compared to January 2021  Recent Labs: 06/28/2019: ALT 29; BUN 11; Hemoglobin 10.7; Platelets 49; Potassium 4.0; Sodium 138 04/06/2020:  Creatinine, Ser 1.40  Recent Lipid Panel    Component Value Date/Time   CHOL  05/04/2009 0445    95        ATP III CLASSIFICATION:  <200     mg/dL   Desirable  200-239  mg/dL   Borderline High  >=240    mg/dL   High          TRIG 43 05/04/2009 0445   HDL 25 (L) 05/04/2009 0445   CHOLHDL 3.8 05/04/2009 0445   VLDL 9 05/04/2009 0445   LDLCALC  05/04/2009 0445    61        Total Cholesterol/HDL:CHD Risk Coronary Heart Disease Risk Table                     Men   Women  1/2 Average Risk   3.4   3.3  Average Risk       5.0   4.4  2 X Average Risk   9.6   7.1  3 X Average Risk  23.4   11.0        Use the calculated Patient Ratio above and the CHD Risk Table to determine the patient's CHD Risk.        ATP III  CLASSIFICATION (LDL):  <100     mg/dL   Optimal  100-129  mg/dL   Near or Above                    Optimal  130-159  mg/dL   Borderline  160-189  mg/dL   High  >190     mg/dL   Very High    Physical Exam:    VS:  BP 112/68   Pulse (!) 52   Ht 6' 3"  (1.905 m)   Wt 228 lb 12.8 oz (103.8 kg)   SpO2 98%   BMI 28.60 kg/m     Wt Readings from Last 3 Encounters:  05/11/20 228 lb 12.8 oz (103.8 kg)  06/28/19 234 lb (106.1 kg)  04/02/19 237 lb 8 oz (107.7 kg)     GEN: Chronically ill. No acute distress HEENT: Normal NECK: No JVD. LYMPHATICS: No lymphadenopathy CARDIAC: No murmur. RRR no gallop, or edema. VASCULAR:  Normal Pulses. No bruits. RESPIRATORY:  Clear to auscultation without rales, wheezing or rhonchi  ABDOMEN: Soft, non-tender, non-distended, No pulsatile mass, MUSCULOSKELETAL: No deformity  SKIN: Warm and dry and slight color.  Not jaundiced. NEUROLOGIC:  Alert and oriented x 3 PSYCHIATRIC:  Normal affect   ASSESSMENT:    1. Coronary artery calcification seen on CAT scan   2. Dilated aortic root ([place])   3. PSVT (paroxysmal supraventricular tachycardia) ([place])   4. Cirrhosis of liver with ascites, unspecified hepatic cirrhosis type ([place])   5. Educated about COVID-19 virus infection   6. Autoimmune hepatitis ([place])    PLAN:    In order of problems listed above:  1. Primary prevention discussed.  Coronary calcification noted. 2. Stable ascending aortic size year-over-year in the 4.2-4.3 range by both CT and echocardiogram.  We will skip a year of follow-up. 3. No PSVT episodes. 4. Followed at [facility] and also by hepatologist in [place].  He is on Prograf 10 numbers seem to have stabilized.  So far not severe enough to be on the official transplant list. 5. No issues  Plan to see him back for CV risk assessment in 1 year.  No imaging is ordered at this time  unless sided symptoms develop we will see him   Medication Adjustments/Labs and  Tests Ordered: Current medicines are reviewed at length with the patient today.  Concerns regarding medicines are outlined above.  Orders Placed This Encounter  Procedures  . EKG 12-Lead   No orders of the defined types were placed in this encounter.   Patient Instructions  Medication Instructions:  Your physician recommends that you continue on your current medications as directed. Please refer to the Current Medication list given to you today.  *If you need a refill on your cardiac medications before your next appointment, please call your pharmacy*   Lab Work: None If you have labs (blood work) drawn today and your tests are completely normal, you will receive your results only by: Marland Kitchen MyChart Message (if you have MyChart) OR . A paper copy in the mail If you have any lab test that is abnormal or we need to change your treatment, we will call you to review the results.   Testing/Procedures: None   Follow-Up: At [facility], you and your health needs are our priority.  As part of our continuing mission to provide you with exceptional heart care, we have created designated Provider Care Teams.  These Care Teams include your primary Cardiologist (physician) and Advanced Practice Providers (APPs -  Physician Assistants and Nurse Practitioners) who all work together to provide you with the care you need, when you need it.  We recommend signing up for the patient portal called "MyChart".  Sign up information is provided on this After Visit Summary.  MyChart is used to connect with patients for Virtual Visits (Telemedicine).  Patients are able to view lab/test results, encounter notes, upcoming appointments, etc.  Non-urgent messages can be sent to your provider as well.   To learn more about what you can do with MyChart, go to [email or web address].    Your next appointment:   1 year(s)  The format for your next appointment:   In Person  Provider:   You may see Sinclair Grooms, MD or one of the following Advanced Practice Providers on your designated Care Team:    Kathyrn Drown, NP    Other Instructions      Signed, Sinclair Grooms, MD  05/11/2020 9:02 AM    [place]

## 2020-05-11 ENCOUNTER — Ambulatory Visit (INDEPENDENT_AMBULATORY_CARE_PROVIDER_SITE_OTHER): Payer: Medicare Other | Admitting: Interventional Cardiology

## 2020-05-11 ENCOUNTER — Encounter: Payer: Self-pay | Admitting: Interventional Cardiology

## 2020-05-11 ENCOUNTER — Other Ambulatory Visit: Payer: Self-pay

## 2020-05-11 VITALS — BP 112/68 | HR 52 | Ht 75.0 in | Wt 228.8 lb

## 2020-05-11 DIAGNOSIS — I2510 Atherosclerotic heart disease of native coronary artery without angina pectoris: Secondary | ICD-10-CM | POA: Diagnosis not present

## 2020-05-11 DIAGNOSIS — I77810 Thoracic aortic ectasia: Secondary | ICD-10-CM

## 2020-05-11 DIAGNOSIS — K7460 Unspecified cirrhosis of liver: Secondary | ICD-10-CM

## 2020-05-11 DIAGNOSIS — K754 Autoimmune hepatitis: Secondary | ICD-10-CM

## 2020-05-11 DIAGNOSIS — R188 Other ascites: Secondary | ICD-10-CM

## 2020-05-11 DIAGNOSIS — I471 Supraventricular tachycardia: Secondary | ICD-10-CM

## 2020-05-11 DIAGNOSIS — I712 Thoracic aortic aneurysm, without rupture: Secondary | ICD-10-CM

## 2020-05-11 DIAGNOSIS — Z7189 Other specified counseling: Secondary | ICD-10-CM

## 2020-05-11 NOTE — Patient Instructions (Signed)
Medication Instructions:  Your physician recommends that you continue on your current medications as directed. Please refer to the Current Medication list given to you today.  *If you need a refill on your cardiac medications before your next appointment, please call your pharmacy*   Lab Work: None If you have labs (blood work) drawn today and your tests are completely normal, you will receive your results only by: Marland Kitchen MyChart Message (if you have MyChart) OR . A paper copy in the mail If you have any lab test that is abnormal or we need to change your treatment, we will call you to review the results.   Testing/Procedures: None   Follow-Up: At [facility], you and your health needs are our priority.  As part of our continuing mission to provide you with exceptional heart care, we have created designated Provider Care Teams.  These Care Teams include your primary Cardiologist (physician) and Advanced Practice Providers (APPs -  Physician Assistants and Nurse Practitioners) who all work together to provide you with the care you need, when you need it.  We recommend signing up for the patient portal called "MyChart".  Sign up information is provided on this After Visit Summary.  MyChart is used to connect with patients for Virtual Visits (Telemedicine).  Patients are able to view lab/test results, encounter notes, upcoming appointments, etc.  Non-urgent messages can be sent to your provider as well.   To learn more about what you can do with MyChart, go to [email or web address].    Your next appointment:   1 year(s)  The format for your next appointment:   In Person  Provider:   You may see Sinclair Grooms, MD or one of the following Advanced Practice Providers on your designated Care Team:    Kathyrn Drown, NP    Other Instructions
